# Patient Record
Sex: Female | Born: 1960
Health system: Southern US, Community
[De-identification: ages and names within clinical notes are randomized; demographics above are authoritative.]

## PROBLEM LIST (undated history)

## (undated) DIAGNOSIS — M549 Dorsalgia, unspecified: Secondary | ICD-10-CM

## (undated) DIAGNOSIS — M1990 Unspecified osteoarthritis, unspecified site: Secondary | ICD-10-CM

## (undated) DIAGNOSIS — K219 Gastro-esophageal reflux disease without esophagitis: Secondary | ICD-10-CM

## (undated) DIAGNOSIS — M797 Fibromyalgia: Secondary | ICD-10-CM

## (undated) DIAGNOSIS — G8929 Other chronic pain: Secondary | ICD-10-CM

## (undated) DIAGNOSIS — I10 Essential (primary) hypertension: Secondary | ICD-10-CM

## (undated) HISTORY — PX: HEMORRHOID SURGERY: SHX153

## (undated) HISTORY — PX: NECK SURGERY: SHX720

## (undated) HISTORY — PX: CHOLECYSTECTOMY: SHX55

## (undated) HISTORY — PX: APPENDECTOMY: SHX54

## (undated) HISTORY — PX: CARPAL TUNNEL RELEASE: SHX101

---

## 1997-11-30 ENCOUNTER — Inpatient Hospital Stay (HOSPITAL_COMMUNITY): Admission: AD | Admit: 1997-11-30 | Discharge: 1997-11-30 | Payer: Self-pay | Admitting: Obstetrics

## 1997-12-03 ENCOUNTER — Inpatient Hospital Stay (HOSPITAL_COMMUNITY): Admission: AD | Admit: 1997-12-03 | Discharge: 1997-12-03 | Payer: Self-pay | Admitting: Obstetrics

## 1998-02-21 ENCOUNTER — Emergency Department (HOSPITAL_COMMUNITY): Admission: EM | Admit: 1998-02-21 | Discharge: 1998-02-21 | Payer: Self-pay | Admitting: Emergency Medicine

## 1998-03-11 ENCOUNTER — Ambulatory Visit (HOSPITAL_COMMUNITY): Admission: RE | Admit: 1998-03-11 | Discharge: 1998-03-11 | Payer: Self-pay | Admitting: Neurosurgery

## 1998-03-11 ENCOUNTER — Encounter: Payer: Self-pay | Admitting: Neurosurgery

## 1998-04-03 ENCOUNTER — Emergency Department (HOSPITAL_COMMUNITY): Admission: EM | Admit: 1998-04-03 | Discharge: 1998-04-03 | Payer: Self-pay | Admitting: Emergency Medicine

## 1998-05-26 ENCOUNTER — Encounter: Payer: Self-pay | Admitting: Emergency Medicine

## 1998-05-26 ENCOUNTER — Emergency Department (HOSPITAL_COMMUNITY): Admission: EM | Admit: 1998-05-26 | Discharge: 1998-05-27 | Payer: Self-pay | Admitting: Emergency Medicine

## 1998-06-24 ENCOUNTER — Inpatient Hospital Stay (HOSPITAL_COMMUNITY): Admission: EM | Admit: 1998-06-24 | Discharge: 1998-06-27 | Payer: Self-pay | Admitting: Emergency Medicine

## 1998-09-18 ENCOUNTER — Emergency Department (HOSPITAL_COMMUNITY): Admission: EM | Admit: 1998-09-18 | Discharge: 1998-09-19 | Payer: Self-pay | Admitting: Emergency Medicine

## 1998-09-19 ENCOUNTER — Encounter: Payer: Self-pay | Admitting: Emergency Medicine

## 1999-03-17 ENCOUNTER — Other Ambulatory Visit: Admission: RE | Admit: 1999-03-17 | Discharge: 1999-03-17 | Payer: Self-pay | Admitting: Obstetrics and Gynecology

## 1999-05-01 ENCOUNTER — Encounter: Payer: Self-pay | Admitting: Obstetrics and Gynecology

## 1999-05-01 ENCOUNTER — Ambulatory Visit (HOSPITAL_COMMUNITY): Admission: RE | Admit: 1999-05-01 | Discharge: 1999-05-01 | Payer: Self-pay | Admitting: Obstetrics and Gynecology

## 1999-05-28 ENCOUNTER — Ambulatory Visit (HOSPITAL_COMMUNITY): Admission: RE | Admit: 1999-05-28 | Discharge: 1999-05-28 | Payer: Self-pay | Admitting: Obstetrics and Gynecology

## 1999-05-28 ENCOUNTER — Encounter: Payer: Self-pay | Admitting: Obstetrics and Gynecology

## 1999-06-16 ENCOUNTER — Encounter: Payer: Self-pay | Admitting: Obstetrics and Gynecology

## 1999-06-16 ENCOUNTER — Ambulatory Visit (HOSPITAL_COMMUNITY): Admission: RE | Admit: 1999-06-16 | Discharge: 1999-06-16 | Payer: Self-pay | Admitting: Obstetrics and Gynecology

## 1999-06-30 ENCOUNTER — Inpatient Hospital Stay (HOSPITAL_COMMUNITY): Admission: AD | Admit: 1999-06-30 | Discharge: 1999-06-30 | Payer: Self-pay | Admitting: Obstetrics and Gynecology

## 1999-07-07 ENCOUNTER — Ambulatory Visit (HOSPITAL_COMMUNITY): Admission: RE | Admit: 1999-07-07 | Discharge: 1999-07-07 | Payer: Self-pay | Admitting: Obstetrics and Gynecology

## 1999-07-07 ENCOUNTER — Encounter: Payer: Self-pay | Admitting: Obstetrics and Gynecology

## 1999-07-08 ENCOUNTER — Ambulatory Visit (HOSPITAL_COMMUNITY): Admission: RE | Admit: 1999-07-08 | Discharge: 1999-07-08 | Payer: Self-pay | Admitting: Obstetrics and Gynecology

## 1999-07-13 ENCOUNTER — Inpatient Hospital Stay (HOSPITAL_COMMUNITY): Admission: AD | Admit: 1999-07-13 | Discharge: 1999-07-13 | Payer: Self-pay | Admitting: Obstetrics and Gynecology

## 1999-07-28 ENCOUNTER — Encounter: Payer: Self-pay | Admitting: Obstetrics and Gynecology

## 1999-07-28 ENCOUNTER — Ambulatory Visit (HOSPITAL_COMMUNITY): Admission: RE | Admit: 1999-07-28 | Discharge: 1999-07-28 | Payer: Self-pay | Admitting: Obstetrics and Gynecology

## 1999-08-02 ENCOUNTER — Inpatient Hospital Stay (HOSPITAL_COMMUNITY): Admission: AD | Admit: 1999-08-02 | Discharge: 1999-08-02 | Payer: Self-pay | Admitting: Obstetrics and Gynecology

## 1999-08-21 ENCOUNTER — Inpatient Hospital Stay (HOSPITAL_COMMUNITY): Admission: AD | Admit: 1999-08-21 | Discharge: 1999-08-21 | Payer: Self-pay | Admitting: Obstetrics and Gynecology

## 1999-08-29 ENCOUNTER — Encounter (HOSPITAL_COMMUNITY): Admission: RE | Admit: 1999-08-29 | Discharge: 1999-11-27 | Payer: Self-pay | Admitting: Obstetrics and Gynecology

## 1999-09-01 ENCOUNTER — Observation Stay (HOSPITAL_COMMUNITY): Admission: AD | Admit: 1999-09-01 | Discharge: 1999-09-01 | Payer: Self-pay | Admitting: Obstetrics and Gynecology

## 1999-09-01 ENCOUNTER — Encounter: Payer: Self-pay | Admitting: Obstetrics and Gynecology

## 2000-01-08 ENCOUNTER — Ambulatory Visit (HOSPITAL_COMMUNITY): Admission: RE | Admit: 2000-01-08 | Discharge: 2000-01-08 | Payer: Self-pay | Admitting: Obstetrics and Gynecology

## 2000-01-08 ENCOUNTER — Encounter: Payer: Self-pay | Admitting: Obstetrics and Gynecology

## 2000-01-28 ENCOUNTER — Emergency Department (HOSPITAL_COMMUNITY): Admission: EM | Admit: 2000-01-28 | Discharge: 2000-01-28 | Payer: Self-pay | Admitting: *Deleted

## 2000-02-09 ENCOUNTER — Encounter: Payer: Self-pay | Admitting: Obstetrics and Gynecology

## 2000-02-09 ENCOUNTER — Ambulatory Visit (HOSPITAL_COMMUNITY): Admission: RE | Admit: 2000-02-09 | Discharge: 2000-02-09 | Payer: Self-pay | Admitting: Obstetrics and Gynecology

## 2000-02-28 ENCOUNTER — Encounter: Payer: Self-pay | Admitting: Neurosurgery

## 2000-02-28 ENCOUNTER — Ambulatory Visit (HOSPITAL_COMMUNITY): Admission: RE | Admit: 2000-02-28 | Discharge: 2000-02-28 | Payer: Self-pay | Admitting: Neurosurgery

## 2001-03-05 ENCOUNTER — Ambulatory Visit (HOSPITAL_COMMUNITY): Admission: RE | Admit: 2001-03-05 | Discharge: 2001-03-05 | Payer: Self-pay | Admitting: Neurosurgery

## 2001-03-05 ENCOUNTER — Encounter: Payer: Self-pay | Admitting: Neurosurgery

## 2001-04-19 ENCOUNTER — Encounter: Payer: Self-pay | Admitting: Neurosurgery

## 2001-04-19 ENCOUNTER — Inpatient Hospital Stay (HOSPITAL_COMMUNITY): Admission: RE | Admit: 2001-04-19 | Discharge: 2001-04-20 | Payer: Self-pay | Admitting: Neurosurgery

## 2004-02-10 ENCOUNTER — Emergency Department (HOSPITAL_COMMUNITY): Admission: EM | Admit: 2004-02-10 | Discharge: 2004-02-10 | Payer: Self-pay | Admitting: Emergency Medicine

## 2005-10-07 ENCOUNTER — Emergency Department (HOSPITAL_COMMUNITY): Admission: EM | Admit: 2005-10-07 | Discharge: 2005-10-07 | Payer: Self-pay | Admitting: *Deleted

## 2005-11-23 ENCOUNTER — Emergency Department (HOSPITAL_COMMUNITY): Admission: EM | Admit: 2005-11-23 | Discharge: 2005-11-23 | Payer: Self-pay | Admitting: Emergency Medicine

## 2006-01-28 ENCOUNTER — Emergency Department (HOSPITAL_COMMUNITY): Admission: EM | Admit: 2006-01-28 | Discharge: 2006-01-28 | Payer: Self-pay | Admitting: Emergency Medicine

## 2006-02-27 ENCOUNTER — Emergency Department (HOSPITAL_COMMUNITY): Admission: EM | Admit: 2006-02-27 | Discharge: 2006-02-28 | Payer: Self-pay | Admitting: Emergency Medicine

## 2006-03-06 ENCOUNTER — Emergency Department (HOSPITAL_COMMUNITY): Admission: EM | Admit: 2006-03-06 | Discharge: 2006-03-06 | Payer: Self-pay | Admitting: Emergency Medicine

## 2006-06-13 ENCOUNTER — Emergency Department (HOSPITAL_COMMUNITY): Admission: EM | Admit: 2006-06-13 | Discharge: 2006-06-14 | Payer: Self-pay | Admitting: Emergency Medicine

## 2006-08-03 ENCOUNTER — Emergency Department (HOSPITAL_COMMUNITY): Admission: EM | Admit: 2006-08-03 | Discharge: 2006-08-03 | Payer: Self-pay | Admitting: Emergency Medicine

## 2006-08-29 ENCOUNTER — Emergency Department (HOSPITAL_COMMUNITY): Admission: EM | Admit: 2006-08-29 | Discharge: 2006-08-29 | Payer: Self-pay | Admitting: Emergency Medicine

## 2006-10-14 ENCOUNTER — Ambulatory Visit (HOSPITAL_COMMUNITY): Admission: RE | Admit: 2006-10-14 | Discharge: 2006-10-14 | Payer: Self-pay | Admitting: Obstetrics

## 2006-10-18 ENCOUNTER — Ambulatory Visit: Payer: Self-pay | Admitting: Family Medicine

## 2006-10-28 ENCOUNTER — Emergency Department (HOSPITAL_COMMUNITY): Admission: EM | Admit: 2006-10-28 | Discharge: 2006-10-29 | Payer: Self-pay | Admitting: Emergency Medicine

## 2006-11-09 ENCOUNTER — Ambulatory Visit: Payer: Self-pay | Admitting: Family Medicine

## 2006-11-09 LAB — CONVERTED CEMR LAB
ALT: 16 units/L (ref 0–35)
Albumin: 4.6 g/dL (ref 3.5–5.2)
BUN: 19 mg/dL (ref 6–23)
Basophils Absolute: 0 10*3/uL (ref 0.0–0.1)
CO2: 27 meq/L (ref 19–32)
Calcium: 9.6 mg/dL (ref 8.4–10.5)
Chloride: 102 meq/L (ref 96–112)
Creatinine, Ser: 1.33 mg/dL — ABNORMAL HIGH (ref 0.40–1.20)
Hemoglobin: 14.4 g/dL (ref 12.0–15.0)
Lymphocytes Relative: 31 % (ref 12–46)
Monocytes Absolute: 0.4 10*3/uL (ref 0.2–0.7)
Monocytes Relative: 6 % (ref 3–11)
Neutro Abs: 4.6 10*3/uL (ref 1.7–7.7)
Potassium: 4 meq/L (ref 3.5–5.3)
RBC: 4.43 M/uL (ref 3.87–5.11)
WBC: 7.4 10*3/uL (ref 4.0–10.5)

## 2007-01-03 ENCOUNTER — Encounter: Admission: RE | Admit: 2007-01-03 | Discharge: 2007-01-03 | Payer: Self-pay | Admitting: Neurosurgery

## 2007-02-15 ENCOUNTER — Ambulatory Visit (HOSPITAL_COMMUNITY): Admission: RE | Admit: 2007-02-15 | Discharge: 2007-02-15 | Payer: Self-pay | Admitting: Family Medicine

## 2007-03-18 ENCOUNTER — Ambulatory Visit: Payer: Self-pay | Admitting: Internal Medicine

## 2007-05-13 ENCOUNTER — Ambulatory Visit: Payer: Self-pay | Admitting: Family Medicine

## 2007-05-19 ENCOUNTER — Emergency Department (HOSPITAL_COMMUNITY): Admission: EM | Admit: 2007-05-19 | Discharge: 2007-05-20 | Payer: Self-pay | Admitting: Emergency Medicine

## 2007-10-04 ENCOUNTER — Encounter (INDEPENDENT_AMBULATORY_CARE_PROVIDER_SITE_OTHER): Payer: Self-pay | Admitting: Family Medicine

## 2007-10-04 ENCOUNTER — Ambulatory Visit: Payer: Self-pay | Admitting: Internal Medicine

## 2007-10-04 LAB — CONVERTED CEMR LAB
Chloride: 104 meq/L (ref 96–112)
Creatinine, Ser: 0.89 mg/dL (ref 0.40–1.20)
Free T4: 0.85 ng/dL — ABNORMAL LOW (ref 0.89–1.80)
Helicobacter Pylori Antibody-IgG: 0.4
TSH: 1.234 microintl units/mL (ref 0.350–4.50)
Vit D, 1,25-Dihydroxy: 27 — ABNORMAL LOW (ref 30–89)

## 2007-10-11 ENCOUNTER — Ambulatory Visit: Payer: Self-pay | Admitting: Family Medicine

## 2007-11-16 ENCOUNTER — Emergency Department (HOSPITAL_COMMUNITY): Admission: EM | Admit: 2007-11-16 | Discharge: 2007-11-17 | Payer: Self-pay | Admitting: Emergency Medicine

## 2007-11-27 ENCOUNTER — Emergency Department (HOSPITAL_COMMUNITY): Admission: EM | Admit: 2007-11-27 | Discharge: 2007-11-27 | Payer: Self-pay | Admitting: Emergency Medicine

## 2007-12-07 ENCOUNTER — Ambulatory Visit (HOSPITAL_COMMUNITY): Admission: RE | Admit: 2007-12-07 | Discharge: 2007-12-07 | Payer: Self-pay | Admitting: Obstetrics

## 2007-12-13 ENCOUNTER — Ambulatory Visit: Payer: Self-pay | Admitting: Family Medicine

## 2007-12-20 ENCOUNTER — Encounter: Admission: RE | Admit: 2007-12-20 | Discharge: 2007-12-20 | Payer: Self-pay | Admitting: Obstetrics

## 2008-01-06 ENCOUNTER — Ambulatory Visit: Payer: Self-pay | Admitting: Family Medicine

## 2008-03-08 ENCOUNTER — Ambulatory Visit: Payer: Self-pay | Admitting: Family Medicine

## 2008-03-09 ENCOUNTER — Ambulatory Visit: Payer: Self-pay | Admitting: Family Medicine

## 2008-03-09 LAB — CONVERTED CEMR LAB
AST: 17 units/L (ref 0–37)
Albumin: 4.2 g/dL (ref 3.5–5.2)
Alkaline Phosphatase: 42 units/L (ref 39–117)
BUN: 16 mg/dL (ref 6–23)
Creatinine, Ser: 0.83 mg/dL (ref 0.40–1.20)
Glucose, Bld: 96 mg/dL (ref 70–99)
HDL: 45 mg/dL (ref >39)
LDL Cholesterol: 146 mg/dL — ABNORMAL HIGH (ref 0–99)
Potassium: 4.2 meq/L (ref 3.5–5.3)
TSH: 1.239 microintl units/mL (ref 0.350–4.50)
Total Bilirubin: 0.5 mg/dL (ref 0.3–1.2)
Total CHOL/HDL Ratio: 4.9
Triglycerides: 147 mg/dL (ref <150)
VLDL: 29 mg/dL (ref 0–40)

## 2008-04-10 ENCOUNTER — Ambulatory Visit: Payer: Self-pay | Admitting: Family Medicine

## 2008-04-12 ENCOUNTER — Ambulatory Visit: Payer: Self-pay | Admitting: Family Medicine

## 2008-04-22 ENCOUNTER — Emergency Department (HOSPITAL_COMMUNITY): Admission: EM | Admit: 2008-04-22 | Discharge: 2008-04-23 | Payer: Self-pay | Admitting: Emergency Medicine

## 2008-05-17 ENCOUNTER — Emergency Department (HOSPITAL_COMMUNITY): Admission: EM | Admit: 2008-05-17 | Discharge: 2008-05-17 | Payer: Self-pay | Admitting: Emergency Medicine

## 2008-06-29 ENCOUNTER — Emergency Department (HOSPITAL_COMMUNITY): Admission: EM | Admit: 2008-06-29 | Discharge: 2008-06-29 | Payer: Self-pay | Admitting: Emergency Medicine

## 2008-09-19 ENCOUNTER — Telehealth (INDEPENDENT_AMBULATORY_CARE_PROVIDER_SITE_OTHER): Payer: Self-pay | Admitting: *Deleted

## 2008-09-20 ENCOUNTER — Ambulatory Visit: Payer: Self-pay | Admitting: Internal Medicine

## 2008-10-09 ENCOUNTER — Ambulatory Visit: Payer: Self-pay | Admitting: Gastroenterology

## 2008-10-23 ENCOUNTER — Ambulatory Visit: Payer: Self-pay | Admitting: Gastroenterology

## 2008-10-23 ENCOUNTER — Encounter: Payer: Self-pay | Admitting: Gastroenterology

## 2008-10-24 ENCOUNTER — Encounter: Payer: Self-pay | Admitting: Gastroenterology

## 2008-12-09 ENCOUNTER — Emergency Department (HOSPITAL_COMMUNITY): Admission: EM | Admit: 2008-12-09 | Discharge: 2008-12-09 | Payer: Self-pay | Admitting: Emergency Medicine

## 2009-12-08 ENCOUNTER — Emergency Department (HOSPITAL_COMMUNITY): Admission: EM | Admit: 2009-12-08 | Discharge: 2009-12-08 | Payer: Self-pay | Admitting: Emergency Medicine

## 2010-02-09 ENCOUNTER — Encounter: Payer: Self-pay | Admitting: Obstetrics

## 2010-02-27 ENCOUNTER — Emergency Department (HOSPITAL_COMMUNITY)
Admission: EM | Admit: 2010-02-27 | Discharge: 2010-02-28 | Disposition: A | Discharge status: Home / Self Care | Payer: Medicaid Other | Attending: Emergency Medicine | Admitting: Emergency Medicine

## 2010-02-27 ENCOUNTER — Emergency Department (HOSPITAL_COMMUNITY): Payer: Medicaid Other

## 2010-02-27 DIAGNOSIS — H53149 Visual discomfort, unspecified: Secondary | ICD-10-CM | POA: Insufficient documentation

## 2010-02-27 DIAGNOSIS — G43909 Migraine, unspecified, not intractable, without status migrainosus: Secondary | ICD-10-CM | POA: Insufficient documentation

## 2010-02-27 DIAGNOSIS — R209 Unspecified disturbances of skin sensation: Secondary | ICD-10-CM | POA: Insufficient documentation

## 2010-02-27 DIAGNOSIS — I10 Essential (primary) hypertension: Secondary | ICD-10-CM | POA: Insufficient documentation

## 2010-02-27 DIAGNOSIS — IMO0001 Reserved for inherently not codable concepts without codable children: Secondary | ICD-10-CM | POA: Insufficient documentation

## 2010-02-27 LAB — CBC
HCT: 38.9 % (ref 36.0–46.0)
MCHC: 35 g/dL (ref 30.0–36.0)
Platelets: 178 10*3/uL (ref 150–400)
RDW: 12.1 % (ref 11.5–15.5)
WBC: 9.7 10*3/uL (ref 4.0–10.5)

## 2010-02-27 LAB — BASIC METABOLIC PANEL
BUN: 14 mg/dL (ref 6–23)
Calcium: 8.7 mg/dL (ref 8.4–10.5)
GFR calc non Af Amer: >60 mL/min (ref >60)
Glucose, Bld: 101 mg/dL — ABNORMAL HIGH (ref 70–99)
Potassium: 3.8 mEq/L (ref 3.5–5.1)
Sodium: 137 mEq/L (ref 135–145)

## 2010-02-27 LAB — DIFFERENTIAL
Basophils Absolute: 0 10*3/uL (ref 0.0–0.1)
Basophils Relative: 0 % (ref 0–1)
Eosinophils Absolute: 0.3 10*3/uL (ref 0.0–0.7)
Eosinophils Relative: 3 % (ref 0–5)
Monocytes Absolute: 0.6 10*3/uL (ref 0.1–1.0)

## 2010-06-06 NOTE — H&P (Signed)
[facility]  Patient:    [name], [name] Visit Number: [id number] MRN: [id number]          Service Type: SUR Location: [id number] Attending Physician:  Emeterio Reeve Dictated by:   Payton Doughty, M.D. Admit Date:  04/19/2001 Discharge Date: 04/20/2001                           History and Physical  ADMITTING DIAGNOSIS:  Spondylosis, L4-5.  SERVICE:  Neurosurgery.  HISTORY OF PRESENT ILLNESS:  This is a now 50 year old right-handed white girl who I saw initially in 1998 for neck pain that resolved slowly.  I visited with her again in early February of this year.  She has had low back pain with pain down her left lower extremity.  She had had an MRI about a year ago that showed spondylitic change with left lateral foraminal encroachment.  It was not below her knee and was not really consistent with L5; it was consistent with L4.  Followup MRI was obtained that shows a really narrowed foramen at L4-5 and the 4 root encumbered as it enters it and she is now admitted for an L4-5 laminotomy and foraminotomy.  MEDICAL HISTORY:  Medical history is otherwise unremarkable.  ALLERGIES:  She has no allergies.  PAST SURGICAL HISTORY:  She has had no operations.  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  REVIEW OF SYSTEMS:  Remarkable for back pain and pain in her left leg.  There is no bladder dysfunction.  FAMILY HISTORY:  Mom is 57 and in good health.  Daddy is 65 and in good health and has hypertension.  PHYSICAL EXAMINATION:  HEENT:  Within normal limits.  NECK:  She has limited range of motion of her neck because of some neck discomfort.  CHEST:  Clear.  CARDIAC:  Regular rate and rhythm.  ABDOMEN:  Nontender with no hepatosplenomegaly.  EXTREMITIES:  Without clubbing or cyanosis.  GU:  Deferred.  PERIPHERAL PULSES:  Good.  NEUROLOGIC:  She  is awake, alert and oriented.  Her cranial nerves are intact. Motor exam shows 5/5 throughout the upper and lower extremities.  The pain is in the distribution of the L4 nerve root.  There is no sensory deficit. Reflexes are intact.  Straight leg raise is positive on the left.  LABORATORY AND ACCESSORY DATA:  MRI shows tightening in her foramen on the left side at 4-5.  This is mainly due to facet arthropathy and a small foramen.  PLAN:  The plan is for a left laminotomy and foraminotomy at L4-5 for decompression of the 4 root.  The risks and benefits of this approach have been discussed with her and she wishes to proceed. Dictated by:   Payton Doughty, M.D. Attending Physician:  Emeterio Reeve DD:  04/19/01 TD:  04/19/01 Job: [id number]

## 2010-06-06 NOTE — Op Note (Signed)
[place]  Patient:    [name] [name], [name] [name] Visit Number: [id number] MRN: [id number]          Service Type: SUR Location: 3000 [id number] Attending Physician:  Emeterio Reeve Dictated by:   Payton Doughty, M.D. Proc. Date: 04/19/01 Admit Date:  04/19/2001 Discharge Date: 04/20/2001                             Operative Report  PREOPERATIVE DIAGNOSIS:  Spondylosis L4-5 on the left.  POSTOPERATIVE DIAGNOSIS:  Spondylosis L4-5 on the left.  OPERATION PERFORMED:  Left L4-5 laminectomy, laminotomy and foraminotomy.  SURGEON:  Payton Doughty, M.D.  COMPLICATIONS:  None.  ASSISTANT: 1. Stefani Dama, M.D. 2. [place].  ANESTHESIA:  General endotracheal.  PREP:  Sterile Betadine prep and scrub with alcohol wipe.  DESCRIPTION OF PROCEDURE:  The patient is a 50 year old lady with L4 radiculopathy on the left side secondary to spondylosis.  The patient was taken to the operating room, smoothly anesthetized and intubated, and placed prone on the operating table.  Following shave, prep and drape in the usual sterile fashion, the skin was infiltrated with 1% lidocaine, 1:400,000 epinephrine.  Skin was incised from mid-L5 to the top of L4 and the lamina of L4 and L5 were exposed on the left side in the subperiosteal plane. Intraoperative x-ray confirmed correctness of level.  Hemisemilaminectomy of L4 was carried out to the top of the ligamentum flavum which was removed in a retrograde fashion.  The L5 root was identified and foraminotomy carried out over it down around the 5 pedicle, somewhat undercutting 5.  The superior aspect of the foramen was was then explored and there  was overhang which compressed the left L4 root.  This was generously decompressed leaving the pars interarticularis intact.  The neural foramen was carefully explored and found to be open.  The wound was irrigated and hemostasis assured.  The laminectomy defect was covered with  Depo-Medrol soaked fat.  The wound was irrigated and hemostasis assured.  The fascia was reapproximated with 0 Vicryl in interrupted fashion.  Subcutaneous tissues reapproximated with 0 Vicryl in interrupted fashion.  Subcuticular tissues reapproximated with 3-0 Vicryl in interrupted fashion.  Skin was closed with 4-0 Vicryl in a running subcuticular fashion.  Benzoin and Steri-Strips were placed and made occlusive with Telfa and OpSite.  The patient then returned to the recovery room in good condition. Dictated by:   Payton Doughty, M.D. Attending Physician:  Emeterio Reeve DD:  04/19/01 TD:  04/19/01 Job: [id number]

## 2010-10-20 LAB — POCT I-STAT, CHEM 8
BUN: 16
Calcium, Ion: 1.06 — ABNORMAL LOW
Chloride: 108
HCT: 42
Sodium: 141
TCO2: 23

## 2010-10-20 LAB — PROTIME-INR: Prothrombin Time: 11.4 — ABNORMAL LOW

## 2010-10-20 LAB — CBC
Platelets: 249
RBC: 4.46
WBC: 10.3

## 2010-10-21 LAB — DIFFERENTIAL
Basophils Absolute: 0
Basophils Relative: 0
Eosinophils Absolute: 0.1
Monocytes Relative: 2 — ABNORMAL LOW
Neutrophils Relative %: 85 — ABNORMAL HIGH

## 2010-10-21 LAB — RAPID STREP SCREEN (MED CTR MEBANE ONLY): Streptococcus, Group A Screen (Direct): NEGATIVE

## 2010-10-21 LAB — CBC
MCHC: 34
MCV: 96
Platelets: 200
RBC: 4.73
RDW: 12.6

## 2010-10-21 LAB — URINALYSIS, ROUTINE W REFLEX MICROSCOPIC
Ketones, ur: NEGATIVE
Nitrite: NEGATIVE
Protein, ur: NEGATIVE
Urobilinogen, UA: 0.2

## 2010-10-21 LAB — BASIC METABOLIC PANEL
BUN: 12
CO2: 22
Chloride: 106
Creatinine, Ser: 0.87
Glucose, Bld: 103 — ABNORMAL HIGH

## 2010-10-21 LAB — URINE MICROSCOPIC-ADD ON

## 2010-10-30 LAB — COMPREHENSIVE METABOLIC PANEL
Albumin: 4.6
Alkaline Phosphatase: 39
BUN: 12
Chloride: 99
Glucose, Bld: 103 — ABNORMAL HIGH
Potassium: 3.3 — ABNORMAL LOW
Total Bilirubin: 1.2

## 2010-10-30 LAB — DIFFERENTIAL
Basophils Absolute: 0
Basophils Relative: 0
Eosinophils Absolute: 0
Monocytes Absolute: 0.3
Neutro Abs: 4.8
Neutrophils Relative %: 69

## 2010-10-30 LAB — URINALYSIS, ROUTINE W REFLEX MICROSCOPIC
Glucose, UA: NEGATIVE
Ketones, ur: NEGATIVE
Leukocytes, UA: NEGATIVE
Protein, ur: NEGATIVE

## 2010-10-30 LAB — URINE MICROSCOPIC-ADD ON

## 2010-10-30 LAB — CBC
HCT: 42.4
Hemoglobin: 14.5
WBC: 7.1

## 2011-02-01 ENCOUNTER — Encounter (HOSPITAL_COMMUNITY): Payer: Self-pay | Admitting: Adult Health

## 2011-02-01 ENCOUNTER — Emergency Department (HOSPITAL_COMMUNITY): Payer: Self-pay

## 2011-02-01 ENCOUNTER — Emergency Department (HOSPITAL_COMMUNITY)
Admission: EM | Admit: 2011-02-01 | Discharge: 2011-02-02 | Disposition: A | Discharge status: Home / Self Care | Payer: Self-pay | Attending: Emergency Medicine | Admitting: Emergency Medicine

## 2011-02-01 DIAGNOSIS — R51 Headache: Secondary | ICD-10-CM | POA: Insufficient documentation

## 2011-02-01 DIAGNOSIS — R110 Nausea: Secondary | ICD-10-CM | POA: Insufficient documentation

## 2011-02-01 DIAGNOSIS — R079 Chest pain, unspecified: Secondary | ICD-10-CM | POA: Insufficient documentation

## 2011-02-01 DIAGNOSIS — R05 Cough: Secondary | ICD-10-CM | POA: Insufficient documentation

## 2011-02-01 DIAGNOSIS — R059 Cough, unspecified: Secondary | ICD-10-CM | POA: Insufficient documentation

## 2011-02-01 DIAGNOSIS — H53149 Visual discomfort, unspecified: Secondary | ICD-10-CM | POA: Insufficient documentation

## 2011-02-01 HISTORY — DX: Unspecified osteoarthritis, unspecified site: M19.90

## 2011-02-01 HISTORY — DX: Essential (primary) hypertension: I10

## 2011-02-01 HISTORY — DX: Fibromyalgia: M79.7

## 2011-02-01 MED ORDER — SODIUM CHLORIDE 0.9 % IV BOLUS (SEPSIS)
1000.0000 mL | Freq: Once | INTRAVENOUS | Status: AC
  Administered 2011-02-01: 1000 mL via INTRAVENOUS

## 2011-02-01 MED ORDER — KETOROLAC TROMETHAMINE 30 MG/ML IJ SOLN
30.0000 mg | Freq: Once | INTRAMUSCULAR | Status: AC
  Administered 2011-02-01: 30 mg via INTRAVENOUS
  Filled 2011-02-01: qty 1

## 2011-02-01 MED ORDER — HYDROMORPHONE HCL PF 1 MG/ML IJ SOLN
1.0000 mg | Freq: Once | INTRAMUSCULAR | Status: AC
  Administered 2011-02-01: 1 mg via INTRAVENOUS
  Filled 2011-02-01: qty 1

## 2011-02-01 MED ORDER — METOCLOPRAMIDE HCL 5 MG/ML IJ SOLN
INTRAMUSCULAR | Status: AC
  Filled 2011-02-01: qty 2

## 2011-02-01 MED ORDER — PROCHLORPERAZINE EDISYLATE 5 MG/ML IJ SOLN: 10.0000 mg | Freq: Once | INTRAMUSCULAR | Status: DC

## 2011-02-01 MED ORDER — METOCLOPRAMIDE HCL 5 MG/ML IJ SOLN
10.0000 mg | Freq: Once | INTRAMUSCULAR | Status: AC
  Administered 2011-02-01: 10 mg via INTRAVENOUS

## 2011-02-01 NOTE — ED Notes (Signed)
Pt's husband at bedside at this time, verbalizing that he is not pleased with his wife's care at this time. States he would like to talk to the charge RN and the doctor at this time. Dois Davenport, RN notified and aware. Dr. Ranae Palms notified and aware. Housekeeping paged to mop floor in room. Odor eliminator sprayed in room. Dirty linen and trash taken out of room and new linens placed on stretcher. Blankets x3 given to patient. Meds given as charted. Will continue to monitor.

## 2011-02-01 NOTE — ED Provider Notes (Signed)
History     CSN: [id number]  Arrival date & time 02/01/11  1732   First MD Initiated Contact with Patient 02/01/11 2109      Chief Complaint  Patient presents with  . Headache    (Consider location/radiation/quality/duration/timing/severity/associated sxs/prior treatment) Patient is a 51 y.o. female presenting with headaches. The history is provided by the patient.  Headache  This is a recurrent problem. The current episode started 6 to 12 hours ago. The problem occurs constantly. The problem has not changed since onset.The headache is associated with bright light, loud noise and coughing. The pain is located in the bilateral region. The quality of the pain is described as throbbing. The pain is moderate. Associated symptoms include nausea. Pertinent negatives include no fever, no chest pressure, no palpitations, no shortness of breath and no vomiting. She has tried nothing for the symptoms.  Pt has long history of migraines and HA she is having today is the same in nature as previous HA. She did not take any pain meds at home. HA was gradual onset starting at noon. She has no neuro deficits, visual changes.  Past Medical History  Diagnosis Date  . Fibromyalgia   . Hypertension   . Arthritis     Past Surgical History  Procedure Date  . Neck surgery     History reviewed. No pertinent family history.  History  Substance Use Topics  . Smoking status: Never Smoker   . Smokeless tobacco: Not on file  . Alcohol Use: Yes    OB History    Grav Para Term Preterm Abortions TAB SAB Ect Mult Living                  Review of Systems  Constitutional: Negative for fever and chills.  HENT: Negative for neck stiffness.   Eyes: Positive for photophobia. Negative for visual disturbance.  Respiratory: Positive for cough. Negative for chest tightness and shortness of breath.   Cardiovascular: Negative for chest pain, palpitations and leg swelling.  Gastrointestinal: Positive for  nausea. Negative for vomiting, abdominal pain and diarrhea.  Musculoskeletal: Negative for myalgias, back pain, joint swelling and arthralgias.  Skin: Negative for color change, pallor, rash and wound.  Neurological: Positive for headaches. Negative for dizziness, speech difficulty, weakness, light-headedness and numbness.    Allergies  Review of patient's allergies indicates no known allergies.  Home Medications   Current Outpatient Rx  Name Route Sig Dispense Refill  . IBUPROFEN 200 MG PO TABS Oral Take 1,200 mg by mouth every 6 (six) hours as needed. headaches      BP 175/101  Pulse 79  Temp(Src) 99 F (37.2 C) (Oral)  Resp 17  SpO2 97%  Physical Exam  Nursing note and vitals reviewed. Constitutional: She is oriented to person, place, and time. She appears well-developed and well-nourished.       Pt in mild distress shielding eyes from light  HENT:  Head: Normocephalic and atraumatic.  Mouth/Throat: Oropharynx is clear and moist.  Eyes: EOM are normal. Pupils are equal, round, and reactive to light.  Neck: Normal range of motion. Neck supple.  Cardiovascular: Normal rate and regular rhythm.  Exam reveals no gallop and no friction rub.   No murmur heard. Pulmonary/Chest: Effort normal and breath sounds normal. No respiratory distress. She has no wheezes. She has no rales.  Abdominal: Soft. Bowel sounds are normal. She exhibits no distension and no mass. There is no tenderness. There is no rebound and no guarding.  Musculoskeletal: Normal range of motion. She exhibits no edema and no tenderness.  Neurological: She is alert and oriented to person, place, and time.       5/5 motor in all ext, sensation intact, CN II-XII intact, finger-to-nose intact.   Skin: Skin is warm and dry. No rash noted. No erythema.  Psychiatric: She has a normal mood and affect. Her behavior is normal.    ED Course  Procedures (including critical care time)  Labs Reviewed - No data to display No  results found.   No diagnosis found.    MDM    Pt seen and eval'd for similar HA here in the past including CT head. I believe this HA represents an exaggeration of pt's chronic HA and will treat symptomatically.         Loren Racer, MD 02/04/11 [phone]

## 2011-02-01 NOTE — ED Notes (Signed)
C/o worse headache of her life that began at 1230 this afternoon. Pain radiates to neck. Associated with light and sound sensitivity. Took IBprofen with no help. painis 10/10 associated with nausea. Pt is alert and oriented and answers all questions appropriately.

## 2011-02-01 NOTE — ED Notes (Signed)
Ranae Palms, MD to bedside.

## 2011-02-02 LAB — CBC
MCH: 31.8 pg (ref 26.0–34.0)
Platelets: 216 10*3/uL (ref 150–400)
RBC: 3.96 MIL/uL (ref 3.87–5.11)
RDW: 11.9 % (ref 11.5–15.5)

## 2011-02-02 LAB — BASIC METABOLIC PANEL
CO2: 25 mEq/L (ref 19–32)
Calcium: 8.1 mg/dL — ABNORMAL LOW (ref 8.4–10.5)
GFR calc Af Amer: >90 mL/min (ref >90)
GFR calc non Af Amer: >90 mL/min (ref >90)
Sodium: 138 mEq/L (ref 135–145)

## 2011-02-02 MED ORDER — NAPROXEN 500 MG PO TABS: 500.0000 mg | ORAL_TABLET | Freq: Two times a day (BID) | ORAL | Status: DC

## 2011-02-02 MED ORDER — PROMETHAZINE HCL 25 MG PO TABS: 25.0000 mg | ORAL_TABLET | Freq: Four times a day (QID) | ORAL | Status: AC | PRN

## 2011-02-02 MED ORDER — OXYCODONE-ACETAMINOPHEN 5-325 MG PO TABS: 1.0000 | ORAL_TABLET | ORAL | Status: AC | PRN

## 2011-02-02 NOTE — ED Provider Notes (Signed)
  Physical Exam  BP 175/101  Pulse 79  Temp(Src) 99 F (37.2 C) (Oral)  Resp 17  SpO2 97%  Physical Exam  ED Course  Procedures  MDM Patient has improved significantly with medications, requests discharge home at this time. Will give prescriptions and have followup with primary doctor or ER if symptoms worsen. Patient and spouse in understanding and agreeable to discharge plan      Vida Roller, MD 02/02/11 [phone]

## 2011-09-08 ENCOUNTER — Emergency Department (HOSPITAL_COMMUNITY)
Admission: EM | Admit: 2011-09-08 | Discharge: 2011-09-08 | Disposition: A | Discharge status: Home / Self Care | Payer: Medicaid Other | Attending: Emergency Medicine | Admitting: Emergency Medicine

## 2011-09-08 ENCOUNTER — Encounter (HOSPITAL_COMMUNITY): Payer: Self-pay | Admitting: *Deleted

## 2011-09-08 DIAGNOSIS — M129 Arthropathy, unspecified: Secondary | ICD-10-CM | POA: Insufficient documentation

## 2011-09-08 DIAGNOSIS — G8929 Other chronic pain: Secondary | ICD-10-CM | POA: Insufficient documentation

## 2011-09-08 DIAGNOSIS — W19XXXA Unspecified fall, initial encounter: Secondary | ICD-10-CM

## 2011-09-08 DIAGNOSIS — I10 Essential (primary) hypertension: Secondary | ICD-10-CM | POA: Insufficient documentation

## 2011-09-08 DIAGNOSIS — M545 Low back pain, unspecified: Secondary | ICD-10-CM | POA: Insufficient documentation

## 2011-09-08 DIAGNOSIS — M549 Dorsalgia, unspecified: Secondary | ICD-10-CM

## 2011-09-08 DIAGNOSIS — IMO0001 Reserved for inherently not codable concepts without codable children: Secondary | ICD-10-CM | POA: Insufficient documentation

## 2011-09-08 HISTORY — DX: Dorsalgia, unspecified: M54.9

## 2011-09-08 HISTORY — DX: Other chronic pain: G89.29

## 2011-09-08 MED ORDER — NAPROXEN 375 MG PO TABS: 375.0000 mg | ORAL_TABLET | Freq: Two times a day (BID) | ORAL | Status: AC

## 2011-09-08 MED ORDER — HYDROCODONE-ACETAMINOPHEN 5-325 MG PO TABS: 1.0000 | ORAL_TABLET | Freq: Four times a day (QID) | ORAL | Status: AC | PRN

## 2011-09-08 MED ORDER — OXYCODONE-ACETAMINOPHEN 5-325 MG PO TABS
1.0000 | ORAL_TABLET | Freq: Once | ORAL | Status: AC
  Administered 2011-09-08: 1 via ORAL
  Filled 2011-09-08: qty 1

## 2011-09-08 MED ORDER — KETOROLAC TROMETHAMINE 60 MG/2ML IM SOLN
60.0000 mg | Freq: Once | INTRAMUSCULAR | Status: AC
  Administered 2011-09-08: 60 mg via INTRAMUSCULAR
  Filled 2011-09-08 (×2): qty 2

## 2011-09-08 MED ORDER — ONDANSETRON 8 MG PO TBDP
8.0000 mg | ORAL_TABLET | Freq: Once | ORAL | Status: AC
  Administered 2011-09-08: 8 mg via ORAL
  Filled 2011-09-08: qty 1

## 2011-09-08 NOTE — ED Notes (Signed)
Pt states she is having lower back pain. Pt denies any numbness or tingling in bilateral lower ext

## 2011-09-08 NOTE — ED Provider Notes (Signed)
Medical screening examination/treatment/procedure(s) were performed by non-physician practitioner and as supervising physician I was immediately available for consultation/collaboration.   Lyanne Co, MD 09/08/11 [phone]

## 2011-09-08 NOTE — ED Provider Notes (Signed)
History     CSN: [id number]  Arrival date & time 09/08/11  1206   First MD Initiated Contact with Patient 09/08/11 1320      Chief Complaint  Patient presents with  . Back Pain    (Consider location/radiation/quality/duration/timing/severity/associated sxs/prior treatment) HPI Comments: Back Pain: Patient presents for presents evaluation of low back problems.  Symptoms have been present for 6 days and include pain in lower lumbar (aching and shooting in character; 10/10 in severity). Initial inciting event: saving a kid that had fallen in a bounce house. Symptoms are worst: all day. Alleviating factors identifiable by patient are none. Exacerbating factors identifiable by patient are bending backwards, bending forwards, bending sideways, running, sitting, standing and walking. Treatments so far initiated by patient: none Previous lower back problems: bulging disk . Previous workup: none. Previous treatments: none.    Patient is a 51 y.o. female presenting with back pain. The history is provided by the patient.  Back Pain  Pertinent negatives include no chest pain, no fever, no numbness, no headaches, no abdominal pain and no weakness.    Past Medical History  Diagnosis Date  . Fibromyalgia   . Hypertension   . Arthritis   . Chronic back pain     Past Surgical History  Procedure Date  . Neck surgery     No family history on file.  History  Substance Use Topics  . Smoking status: Never Smoker   . Smokeless tobacco: Not on file  . Alcohol Use: Yes    OB History    Grav Para Term Preterm Abortions TAB SAB Ect Mult Living                  Review of Systems  Constitutional: Positive for activity change. Negative for fever, chills, fatigue and unexpected weight change.  HENT: Negative for neck pain and neck stiffness.   Eyes: Negative for visual disturbance.  Respiratory: Negative for shortness of breath.   Cardiovascular: Negative for chest pain and leg swelling.    Gastrointestinal: Negative for nausea, abdominal pain, constipation and rectal pain.  Genitourinary: Negative for urgency and difficulty urinating.       Patient denies bowel and bladder incontinence.  Musculoskeletal: Positive for back pain and gait problem. Negative for myalgias, joint swelling and arthralgias.  Neurological: Negative for weakness, numbness and headaches.  All other systems reviewed and are negative.    Allergies  Review of patient's allergies indicates no known allergies.  Home Medications   Current Outpatient Rx  Name Route Sig Dispense Refill  . IBUPROFEN 200 MG PO TABS Oral Take 800 mg by mouth every 6 (six) hours as needed. Backache      BP 152/90  Pulse 63  Temp 99.1 F (37.3 C) (Oral)  Resp 18  SpO2 100%  Physical Exam  Nursing note and vitals reviewed. Constitutional: She is oriented to person, place, and time. She appears well-developed and well-nourished. No distress.  HENT:  Head: Normocephalic and atraumatic.  Eyes: Conjunctivae and EOM are normal. Pupils are equal, round, and reactive to light. No scleral icterus.  Neck: Normal range of motion and full passive range of motion without pain. Neck supple. No spinous process tenderness and no muscular tenderness present. No rigidity. Normal range of motion present. No Brudzinski's sign noted.  Cardiovascular: Normal rate, regular rhythm and intact distal pulses.  Exam reveals no gallop and no friction rub.   No murmur heard. Pulmonary/Chest: Effort normal and breath sounds normal. No respiratory  distress. She has no wheezes. She has no rales. She exhibits no tenderness.  Musculoskeletal:       Cervical back: She exhibits normal range of motion, no tenderness, no bony tenderness and no pain.       Thoracic back: She exhibits no tenderness, no bony tenderness and no pain.       Lumbar back: She exhibits tenderness, bony tenderness and pain. She exhibits no spasm and normal pulse.       Right foot:  She exhibits no swelling.       Left foot: She exhibits no swelling.       Bilateral lower extremities nontender without color change, baseline range of motion of extremities with intact distal pulses, capillary refill less than 2 seconds bilaterally.  Pt has increased pain w ROM of lumbar spine. Pain w ambulation, no sign of ataxia.  Neurological: She is alert and oriented to person, place, and time. She has normal strength and normal reflexes. No sensory deficit. Gait (no ataxia, slowed and hunched d/t pain ) abnormal.       Sensation at baseline for light touch in all 4 distal extremities, motor symmetric & bilateral 5/5 (hips: abduction, adduction, flexion; knee: flexion & extension; foot: dorsiflexion, plantar flexion, toes: dorsi flexion) Patellar & ankle reflexes intact.   Skin: Skin is warm and dry. No rash noted. She is not diaphoretic. No erythema. No pallor.  Psychiatric: She has a normal mood and affect.    ED Course  Procedures (including critical care time)  Labs Reviewed - No data to display No results found.   No diagnosis found.  Pt w nausea after percocet, gave zofran. Feeling better.   MDM  Low back pain, acute on chronic exacerbation  Patient with back pain.  No neurological deficits and normal neuro exam.  Patient can walk but states is painful.  No loss of bowel or bladder control.  No concern for cauda equina.  No fever, night sweats, weight loss, h/o cancer, IVDU.  RICE protocol and pain medicine indicated and discussed with patient.           Jaci Carrel, [place] 09/08/11 1513

## 2011-09-08 NOTE — ED Notes (Signed)
Pt has chronic back pain, Thursday was hit in back by child playing, child in [place] house and landed on her back. Pt uncomfortable in triage

## 2012-12-07 ENCOUNTER — Emergency Department (HOSPITAL_COMMUNITY): Payer: Self-pay

## 2012-12-07 ENCOUNTER — Encounter (HOSPITAL_COMMUNITY): Payer: Self-pay | Admitting: Emergency Medicine

## 2012-12-07 ENCOUNTER — Emergency Department (HOSPITAL_COMMUNITY)
Admission: EM | Admit: 2012-12-07 | Discharge: 2012-12-07 | Disposition: A | Discharge status: Home / Self Care | Payer: Self-pay | Attending: Emergency Medicine | Admitting: Emergency Medicine

## 2012-12-07 DIAGNOSIS — IMO0001 Reserved for inherently not codable concepts without codable children: Secondary | ICD-10-CM | POA: Insufficient documentation

## 2012-12-07 DIAGNOSIS — R110 Nausea: Secondary | ICD-10-CM | POA: Insufficient documentation

## 2012-12-07 DIAGNOSIS — M542 Cervicalgia: Secondary | ICD-10-CM | POA: Insufficient documentation

## 2012-12-07 DIAGNOSIS — M25519 Pain in unspecified shoulder: Secondary | ICD-10-CM | POA: Insufficient documentation

## 2012-12-07 DIAGNOSIS — G8929 Other chronic pain: Secondary | ICD-10-CM | POA: Insufficient documentation

## 2012-12-07 DIAGNOSIS — M25512 Pain in left shoulder: Secondary | ICD-10-CM

## 2012-12-07 DIAGNOSIS — R0789 Other chest pain: Secondary | ICD-10-CM | POA: Insufficient documentation

## 2012-12-07 DIAGNOSIS — I10 Essential (primary) hypertension: Secondary | ICD-10-CM | POA: Insufficient documentation

## 2012-12-07 DIAGNOSIS — M129 Arthropathy, unspecified: Secondary | ICD-10-CM | POA: Insufficient documentation

## 2012-12-07 LAB — BASIC METABOLIC PANEL
CO2: 27 mEq/L (ref 19–32)
Chloride: 103 mEq/L (ref 96–112)
Glucose, Bld: 98 mg/dL (ref 70–99)
Potassium: 3.6 mEq/L (ref 3.5–5.1)
Sodium: 142 mEq/L (ref 135–145)

## 2012-12-07 LAB — CBC WITH DIFFERENTIAL/PLATELET
Basophils Absolute: 0 10*3/uL (ref 0.0–0.1)
Lymphocytes Relative: 34 % (ref 12–46)
Lymphs Abs: 2.6 10*3/uL (ref 0.7–4.0)
MCV: 90.7 fL (ref 78.0–100.0)
Neutro Abs: 4.4 10*3/uL (ref 1.7–7.7)
Neutrophils Relative %: 58 % (ref 43–77)
Platelets: 208 10*3/uL (ref 150–400)
RBC: 4.53 MIL/uL (ref 3.87–5.11)
RDW: 12.2 % (ref 11.5–15.5)
WBC: 7.6 10*3/uL (ref 4.0–10.5)

## 2012-12-07 LAB — TROPONIN I: Troponin I: <0.3 ng/mL (ref <0.30)

## 2012-12-07 MED ORDER — HYDROCHLOROTHIAZIDE 25 MG PO TABS: 25.0000 mg | ORAL_TABLET | Freq: Every day | ORAL | Status: DC

## 2012-12-07 MED ORDER — OXYCODONE-ACETAMINOPHEN 5-325 MG PO TABS: 1.0000 | ORAL_TABLET | ORAL | Status: DC | PRN

## 2012-12-07 MED ORDER — HYDROMORPHONE HCL PF 1 MG/ML IJ SOLN
1.0000 mg | Freq: Once | INTRAMUSCULAR | Status: AC
  Administered 2012-12-07: 1 mg via INTRAVENOUS
  Filled 2012-12-07: qty 1

## 2012-12-07 NOTE — ED Notes (Addendum)
Left arm pain and shoulder pain bad  x 2 days has been going on x several months nauseated has not been taking her bp meds due to having no insurance

## 2012-12-07 NOTE — ED Provider Notes (Signed)
CSN: [id number]     Arrival date & time 12/07/12  1235 History   First MD Initiated Contact with Patient 12/07/12 1453     Chief Complaint  Patient presents with  . Arm Pain   (Consider location/radiation/quality/duration/timing/severity/associated sxs/prior Treatment) HPI Comments: 52 year old female presents with 2 months of left shoulder pain. States the pain has gotten worse over the past week to the point that she sought care in the ER. Denies any shortness of breath. She states sometimes she has nausea denied vomiting. No fevers. No weakness or numbness. Initially the pain was intermittent, worse only with using her arm but now it is hurting all the time. Has a history of hypertension postoperatively in her medicine for several months since she lost her Medicaid. His been taking ibuprofen for this but has not helped. States now the pain has moved into her chest as well. His feels like both a sharp and a dull pain that intermittently changes. Denies any known injury.   Past Medical History  Diagnosis Date  . Fibromyalgia   . Hypertension   . Arthritis   . Chronic back pain    Past Surgical History  Procedure Laterality Date  . Neck surgery     No family history on file. History  Substance Use Topics  . Smoking status: Never Smoker   . Smokeless tobacco: Not on file  . Alcohol Use: Yes   OB History   Grav Para Term Preterm Abortions TAB SAB Ect Mult Living                 Review of Systems  Constitutional: Negative for fever and chills.  Respiratory: Negative for shortness of breath.   Cardiovascular: Positive for chest pain.  Gastrointestinal: Positive for nausea. Negative for vomiting and abdominal pain.  Musculoskeletal: Positive for neck pain.  Neurological: Negative for weakness and numbness.  All other systems reviewed and are negative.    Allergies  Review of patient's allergies indicates no known allergies.  Home Medications   Current Outpatient Rx   Name  Route  Sig  Dispense  Refill  . acetaminophen (TYLENOL) 500 MG tablet   Oral   Take 1,500 mg by mouth every 6 (six) hours as needed for moderate pain.          BP 212/99  Pulse 82  Temp(Src) 99.1 F (37.3 C)  Resp 16  SpO2 99% Physical Exam  Nursing note and vitals reviewed. Constitutional: She is oriented to person, place, and time. She appears well-developed and well-nourished.  HENT:  Head: Normocephalic and atraumatic.  Right Ear: External ear normal.  Left Ear: External ear normal.  Nose: Nose normal.  Eyes: Right eye exhibits no discharge. Left eye exhibits no discharge.  Cardiovascular: Normal rate, regular rhythm, normal heart sounds and intact distal pulses.   Pulmonary/Chest: Effort normal and breath sounds normal. She exhibits tenderness (Left anterior chest).  Abdominal: Soft. She exhibits no distension. There is no tenderness.  Musculoskeletal:       Left shoulder: She exhibits decreased range of motion and tenderness. She exhibits normal pulse and normal strength.  Neurological: She is alert and oriented to person, place, and time. She has normal strength. No sensory deficit.  5/5 strength in all 4 extremities  Skin: Skin is warm and dry.    ED Course  Procedures (including critical care time) Labs Review Labs Reviewed  BASIC METABOLIC PANEL - Abnormal; Notable for the following:    GFR calc non Af Denyse Dago  76 (*)    GFR calc Af Amer 89 (*)    All other components within normal limits  CBC WITH DIFFERENTIAL  TROPONIN I   Imaging Review Dg Chest 2 View  12/07/2012   CLINICAL DATA:  Pain in left shoulder for 2 months, worse in the last week with no known injury  EXAM: CHEST  2 VIEW  COMPARISON:  07/15/2012  FINDINGS: Heart size and vascular pattern normal. Lungs clear. No pleural effusion. Status post multilevel anterior fixation lower cervical spine noted. Status post cholecystectomy noted.  IMPRESSION: No acute findings.   Electronically Signed   By:  Esperanza Heir M.D.   On: 12/07/2012 16:33   Dg Shoulder Left  12/07/2012   CLINICAL DATA:  Left shoulder pain for several months with no known injury  EXAM: LEFT SHOULDER - 2+ VIEW  COMPARISON:  None.  FINDINGS: Mild acromioclavicular joint degeneration. Glenohumeral joint shows no evidence of significant degenerative change. There is no fracture or dislocation.  IMPRESSION: No acute abnormalities.   Electronically Signed   By: Esperanza Heir M.D.   On: 12/07/2012 16:33    EKG Interpretation    Date/Time:  Wednesday December 07 2012 12:41:29 EST Ventricular Rate:  76 PR Interval:  154 QRS Duration: 76 QT Interval:  392 QTC Calculation: 441 R Axis:   49 Text Interpretation:  Normal sinus rhythm Normal ECG No significant change since last tracing Confirmed by Malayjah Otoole  MD, Aliegha Paullin ([id number]) on 12/07/2012 2:42:07 PM            MDM   1. Left shoulder pain   2. Hypertension    Patient's pain seems to be of musculoskeletal etiology. Low concern for ACS given the chronicity of her symptoms and significant pain with range of motion. She has a normal EKG and normal troponin as well. I did stress that her hypertension needs to be controlled she's been back on hypertension meds, will start her on HCTZ. She has a PCP and I recommended that she followup closely as possible with him. We'll give range of motion exercises for her shoulder and recommended close followup as well.    Audree Camel, MD 12/07/12 [phone]

## 2012-12-07 NOTE — ED Notes (Signed)
Pt will leave at 1723, IV pain med protocol

## 2013-01-07 ENCOUNTER — Encounter (HOSPITAL_COMMUNITY): Payer: Self-pay | Admitting: Emergency Medicine

## 2013-01-07 ENCOUNTER — Emergency Department (HOSPITAL_COMMUNITY): Payer: Self-pay

## 2013-01-07 ENCOUNTER — Emergency Department (HOSPITAL_COMMUNITY)
Admission: EM | Admit: 2013-01-07 | Discharge: 2013-01-07 | Disposition: A | Discharge status: Home / Self Care | Payer: Self-pay | Attending: Emergency Medicine | Admitting: Emergency Medicine

## 2013-01-07 DIAGNOSIS — IMO0001 Reserved for inherently not codable concepts without codable children: Secondary | ICD-10-CM | POA: Insufficient documentation

## 2013-01-07 DIAGNOSIS — R197 Diarrhea, unspecified: Secondary | ICD-10-CM | POA: Insufficient documentation

## 2013-01-07 DIAGNOSIS — J069 Acute upper respiratory infection, unspecified: Secondary | ICD-10-CM | POA: Insufficient documentation

## 2013-01-07 DIAGNOSIS — R0789 Other chest pain: Secondary | ICD-10-CM | POA: Insufficient documentation

## 2013-01-07 DIAGNOSIS — R112 Nausea with vomiting, unspecified: Secondary | ICD-10-CM | POA: Insufficient documentation

## 2013-01-07 DIAGNOSIS — B9789 Other viral agents as the cause of diseases classified elsewhere: Secondary | ICD-10-CM | POA: Insufficient documentation

## 2013-01-07 DIAGNOSIS — B349 Viral infection, unspecified: Secondary | ICD-10-CM

## 2013-01-07 DIAGNOSIS — Z79899 Other long term (current) drug therapy: Secondary | ICD-10-CM | POA: Insufficient documentation

## 2013-01-07 DIAGNOSIS — G8929 Other chronic pain: Secondary | ICD-10-CM | POA: Insufficient documentation

## 2013-01-07 DIAGNOSIS — M129 Arthropathy, unspecified: Secondary | ICD-10-CM | POA: Insufficient documentation

## 2013-01-07 DIAGNOSIS — R21 Rash and other nonspecific skin eruption: Secondary | ICD-10-CM | POA: Insufficient documentation

## 2013-01-07 DIAGNOSIS — I10 Essential (primary) hypertension: Secondary | ICD-10-CM | POA: Insufficient documentation

## 2013-01-07 LAB — COMPREHENSIVE METABOLIC PANEL
ALT: 26 U/L (ref 0–35)
BUN: 18 mg/dL (ref 6–23)
Calcium: 9.3 mg/dL (ref 8.4–10.5)
GFR calc Af Amer: 69 mL/min — ABNORMAL LOW (ref >90)
Glucose, Bld: 98 mg/dL (ref 70–99)
Sodium: 137 mEq/L (ref 135–145)
Total Protein: 8.1 g/dL (ref 6.0–8.3)

## 2013-01-07 LAB — CBC WITH DIFFERENTIAL/PLATELET
Basophils Absolute: 0 10*3/uL (ref 0.0–0.1)
Basophils Relative: 0 % (ref 0–1)
Eosinophils Absolute: 0.2 10*3/uL (ref 0.0–0.7)
Eosinophils Relative: 2 % (ref 0–5)
Lymphs Abs: 3.2 10*3/uL (ref 0.7–4.0)
MCH: 32.3 pg (ref 26.0–34.0)
MCHC: 35 g/dL (ref 30.0–36.0)
MCV: 92.2 fL (ref 78.0–100.0)
Monocytes Absolute: 0.7 10*3/uL (ref 0.1–1.0)
Platelets: 181 10*3/uL (ref 150–400)
RDW: 12.5 % (ref 11.5–15.5)

## 2013-01-07 LAB — TROPONIN I: Troponin I: <0.3 ng/mL (ref <0.30)

## 2013-01-07 MED ORDER — BENZONATATE 100 MG PO CAPS: 100.0000 mg | ORAL_CAPSULE | Freq: Three times a day (TID) | ORAL | Status: DC

## 2013-01-07 MED ORDER — ACYCLOVIR 400 MG PO TABS: 400.0000 mg | ORAL_TABLET | ORAL | Status: DC

## 2013-01-07 MED ORDER — ACETAMINOPHEN 325 MG PO TABS
650.0000 mg | ORAL_TABLET | Freq: Once | ORAL | Status: AC
  Administered 2013-01-07: 650 mg via ORAL
  Filled 2013-01-07: qty 2

## 2013-01-07 MED ORDER — SODIUM CHLORIDE 0.9 % IV BOLUS (SEPSIS)
1000.0000 mL | Freq: Once | INTRAVENOUS | Status: AC
  Administered 2013-01-07: 1000 mL via INTRAVENOUS

## 2013-01-07 MED ORDER — ACYCLOVIR 400 MG PO TABS: 400.0000 mg | ORAL_TABLET | Freq: Every day | ORAL | Status: DC

## 2013-01-07 NOTE — ED Provider Notes (Signed)
cmet returned and is normal.  Pt discharged stable   Lonia Skinner [place], [place] 01/07/13 1731

## 2013-01-07 NOTE — ED Notes (Signed)
Patient tried to urinate but couldnt 

## 2013-01-07 NOTE — ED Notes (Signed)
Pt from home c/o flu-like symptoms x1 week. Pt reports cough, fever, sore throat, body aches, unable to eat/drink. Pt is A&O and in NAD

## 2013-01-07 NOTE — ED Provider Notes (Signed)
CSN: [id number]     Arrival date & time 01/07/13  1304 History   First MD Initiated Contact with Patient 01/07/13 1410     Chief Complaint  Patient presents with  . Cough  . Sore Throat  . Fever   (Consider location/radiation/quality/duration/timing/severity/associated sxs/prior Treatment) The history is provided by the patient. No language interpreter was used.  [name] [name] is a 52 y/o F with PMHx of HTN, arthritis, fibromyalgia, chronic back pain presenting to the ED with sore throat, bodyaches, fever, cough, nasal congestion that has been ongoing for the past week. Patient reported that the sore throat is worse with swallowing food. Reported that she has been coughing up green sputum. Stated that rhinorrhea is a yellow/greenish color. Reported that she has been using Ibuprofen and Theraflu OTC with minimal relief. Stated that she has been having difficulty breathing, secondary to congestion. Patient reported that she has been experiencing chest tightness, only associated with cough. Stated that she had fever intermittently, Tmax of 102.6 degrees Fahrenheit. Reported diarrhea, nausea, emesis - stated that she has had 3 episodes of emesis - mainly of phlegm and food - NB/NB. Reported that father was diagnosed with flu last week and that she was around him. Denied flu vaccine. Denied facial pressure, difficulty swallowing, urinary issues, dysuria, hematuria, ear pain, numbness, tingling, hemoptysis, travel.  PCP none   Past Medical History  Diagnosis Date  . Fibromyalgia   . Hypertension   . Arthritis   . Chronic back pain    Past Surgical History  Procedure Laterality Date  . Neck surgery     No family history on file. History  Substance Use Topics  . Smoking status: Never Smoker   . Smokeless tobacco: Not on file  . Alcohol Use: Yes   OB History   Grav Para Term Preterm Abortions TAB SAB Ect Mult Living                 Review of Systems  Constitutional: Positive for  fever and chills.  HENT: Positive for congestion and sore throat. Negative for trouble swallowing.   Respiratory: Positive for cough and chest tightness.   Cardiovascular: Negative for chest pain.  Gastrointestinal: Positive for nausea, vomiting and diarrhea. Negative for abdominal pain, blood in stool and anal bleeding.  Musculoskeletal: Positive for myalgias.  Neurological: Negative for dizziness and weakness.  All other systems reviewed and are negative.    Allergies  Review of patient's allergies indicates no known allergies.  Home Medications   Current Outpatient Rx  Name  Route  Sig  Dispense  Refill  . acetaminophen (TYLENOL) 500 MG tablet   Oral   Take 1,500 mg by mouth every 6 (six) hours as needed for moderate pain.         . Acetaminophen-Guaifenesin (THERAFLU FLU/CHEST CONGESTION PO)   Oral   Take 1 Package by mouth daily.         . hydrochlorothiazide (HYDRODIURIL) 25 MG tablet   Oral   Take 1 tablet (25 mg total) by mouth daily.   30 tablet   0    BP 132/83  Pulse 85  Temp(Src) 99.7 F (37.6 C) (Oral)  Resp 20  SpO2 98% Physical Exam  Nursing note and vitals reviewed. Constitutional: She is oriented to person, place, and time. She appears well-developed and well-nourished. No distress.  HENT:  Head: Normocephalic and atraumatic.  Mouth/Throat: Oropharynx is clear and moist. No oropharyngeal exudate.  Negative swelling, erythema, inflammation, lesions, sores,  petechiae, exudate noted to the posterior oropharynx and tonsils bilaterally. Negative uvula swelling - uvula midline, symmetrical elevation.   Eyes: Conjunctivae and EOM are normal. Pupils are equal, round, and reactive to light. Right eye exhibits no discharge. Left eye exhibits no discharge.  Neck: Normal range of motion. Neck supple.  Negative neck stiffness Negative nuchal rigidity Negative cervical LAD Negative pain upon palpation to the neck  Negative meningeal signs  Cardiovascular:  Normal rate, regular rhythm and normal heart sounds.  Exam reveals no friction rub.   No murmur heard. Pulses:      Radial pulses are 2+ on the right side, and 2+ on the left side.       Dorsalis pedis pulses are 2+ on the right side, and 2+ on the left side.  Pulmonary/Chest: Effort normal and breath sounds normal. No respiratory distress. She has no wheezes. She has no rales. She exhibits no tenderness.  Negative respiratory distress Patient is able to speak in full sentences without difficulty  Negative use of accessory muscles  Abdominal: Soft. Bowel sounds are normal. There is no tenderness. There is no guarding.  Musculoskeletal: Normal range of motion.  Full ROM to upper and lower extremities without difficulty noted, negative ataxia noted  Lymphadenopathy:    She has no cervical adenopathy.  Neurological: She is alert and oriented to person, place, and time. She exhibits normal muscle tone. Coordination normal.  Cranial nerves III-XII grossly intact Strength 5+/5+ to upper and lower extremities bilaterally with resistance applied, equal distribution noted  Skin: Skin is warm. Rash noted. She is not diaphoretic.  Fluid filled vesicle with erythematous halo noted underneath left breast. Negative pain upon palpation. Negative active drainage noted. Blanchable blotches localized on the abdomen on the left side.  Psychiatric: She has a normal mood and affect. Her behavior is normal. Thought content normal.    ED Course  Procedures (including critical care time)  Results for orders placed during the hospital encounter of 01/07/13  RAPID STREP SCREEN      Result Value Range   Streptococcus, Group A Screen (Direct) NEGATIVE  NEGATIVE  CBC WITH DIFFERENTIAL      Result Value Range   WBC 9.7  4.0 - 10.5 K/uL   RBC 4.49  3.87 - 5.11 MIL/uL   Hemoglobin 14.5  12.0 - 15.0 g/dL   HCT 16.1  09.6 - 04.5 %   MCV 92.2  78.0 - 100.0 fL   MCH 32.3  26.0 - 34.0 pg   MCHC 35.0  30.0 - 36.0  g/dL   RDW 40.9  81.1 - 91.4 %   Platelets 181  150 - 400 K/uL   Neutrophils Relative % 58  43 - 77 %   Neutro Abs 5.6  1.7 - 7.7 K/uL   Lymphocytes Relative 33  12 - 46 %   Lymphs Abs 3.2  0.7 - 4.0 K/uL   Monocytes Relative 8  3 - 12 %   Monocytes Absolute 0.7  0.1 - 1.0 K/uL   Eosinophils Relative 2  0 - 5 %   Eosinophils Absolute 0.2  0.0 - 0.7 K/uL   Basophils Relative 0  0 - 1 %   Basophils Absolute 0.0  0.0 - 0.1 K/uL  COMPREHENSIVE METABOLIC PANEL      Result Value Range   Sodium 137  135 - 145 mEq/L   Potassium 3.8  3.5 - 5.1 mEq/L   Chloride 99  96 - 112 mEq/L   CO2  26  19 - 32 mEq/L   Glucose, Bld 98  70 - 99 mg/dL   BUN 18  6 - 23 mg/dL   Creatinine, Ser 1.07  0.50 - 1.10 mg/dL   Calcium 9.3  8.4 - 10.5 mg/dL   Total Protein 8.1  6.0 - 8.3 g/dL   Albumin 4.1  3.5 - 5.2 g/dL   AST 24  0 - 37 U/L   ALT 26  0 - 35 U/L   Alkaline Phosphatase 64  39 - 117 U/L   Total Bilirubin 0.7  0.3 - 1.2 mg/dL   GFR calc non Af Amer 59 (*) >90 mL/min   GFR calc Af Amer 69 (*) >90 mL/min  TROPONIN I      Result Value Range   Troponin I <0.30  <0.30 ng/mL   Dg Chest 2 View  01/07/2013   CLINICAL DATA:  Three-day history of cough, fever, and sore throat.  EXAM: CHEST  2 VIEW  COMPARISON:  PA and lateral chest x-ray of December 07, 2012.  FINDINGS: The lungs are adequately inflated. There is no focal infiltrate. The cardiopericardial silhouette is normal in size. The pulmonary vascularity is not engorged. The mediastinum is normal in width. There is no pleural effusion. The trachea is midline. The observed portions of the bony thorax are normal.  IMPRESSION: There is no evidence of pneumonia nor CHF nor other acute cardiopulmonary abnormality.   Electronically Signed   By: David  Martinique   On: 01/07/2013 15:02     Labs Review Labs Reviewed  RAPID STREP SCREEN  CULTURE, GROUP A STREP  CBC WITH DIFFERENTIAL  TROPONIN I  COMPREHENSIVE METABOLIC PANEL  URINALYSIS, ROUTINE W REFLEX  MICROSCOPIC  PREGNANCY, URINE   Imaging Review Dg Chest 2 View  01/07/2013   CLINICAL DATA:  Three-day history of cough, fever, and sore throat.  EXAM: CHEST  2 VIEW  COMPARISON:  PA and lateral chest x-ray of December 07, 2012.  FINDINGS: The lungs are adequately inflated. There is no focal infiltrate. The cardiopericardial silhouette is normal in size. The pulmonary vascularity is not engorged. The mediastinum is normal in width. There is no pleural effusion. The trachea is midline. The observed portions of the bony thorax are normal.  IMPRESSION: There is no evidence of pneumonia nor CHF nor other acute cardiopulmonary abnormality.   Electronically Signed   By: David  Martinique   On: 01/07/2013 15:02    EKG Interpretation    Date/Time:  Saturday January 07 2013 15:24:49 EST Ventricular Rate:  81 PR Interval:  145 QRS Duration: 81 QT Interval:  383 QTC Calculation: 445 R Axis:   46 Text Interpretation:  Sinus rhythm since last tracing no significant change Confirmed by WENTZ  MD, ELLIOTT ([id number]) on 01/07/2013 4:04:02 PM            MDM  No diagnosis found.  Medications  sodium chloride 0.9 % bolus 1,000 mL (1,000 mLs Intravenous New Bag/Given 01/07/13 1517)  acetaminophen (TYLENOL) tablet 650 mg (650 mg Oral Given 01/07/13 1650)  sodium chloride 0.9 % bolus 1,000 mL (1,000 mLs Intravenous New Bag/Given 01/07/13 1708)   Filed Vitals:   01/07/13 1307  BP: 132/83  Pulse: 85  Temp: 99.7 F (37.6 C)  TempSrc: Oral  Resp: 20  SpO2: 98%    Patient presenting to the ED with nasal congestion, productive cough, rhinorrhea, chest tightness that worsens with coughing, generalized myalgias, fever, chills. Reported that father was diagnosed with flu last week.  Denied flu vaccine.  Alert and oriented. GCS 15. Heart rate and rhythm normal. Lungs clear to auscultation to upper and lower lobes. Pulses palpable and strong, radial and DP 2+ bilaterally. Full ROM to upper and lower  extremities bilaterally. Negative pain upon palpation to the chest wall. BS normoactive in all quadrants, soft, nontender - negative acute abdomen, negative peritoneal signs. Dry cough noted upon exam. Negative neck stiffness, negative meningeal signs. Negative pain upon palpation to the neck. Nasal congestion noted. Strength intact with equal distribution. Fluid filled vesicle with erythematous base identified underneath the left breast. Blanchable blotches localized on the abdomen on the left side.  EKG negative ischemic findings. Troponin negative elevation. Chest xray negative. CBC negative elevated WBC, negative left shift. Rapid strep negative. CMP negative findings. Rapid strep negative. Doubt pneumonia. Doubt cardiac nature. Suspicion to be viral in nature, URI. Rash - doubt scabies, doubt SJS, doubt erythema multiforme major and minor - suspicion to be possible beginnings of shingles, cannot rule out dermatitis. Discharge paperwork complete with referral to Urgent Care Center. Discussed supportive therapy. Acyclovir prescribed if rash worsens - guideline given. Medications for cough administered. Discussed case with Langston Masker, PA-C. Transfer of care to Langston Masker, PA-C at change in shift.     Raymon Mutton, PA-C 01/07/13 1733

## 2013-01-08 NOTE — ED Provider Notes (Signed)
Medical screening examination/treatment/procedure(s) were performed by non-physician practitioner and as supervising physician I was immediately available for consultation/collaboration.  Lynisha Osuch L Icesis Renn, MD 01/08/13 0038 

## 2013-01-09 LAB — CULTURE, GROUP A STREP

## 2013-01-10 NOTE — ED Provider Notes (Signed)
Medical screening examination/treatment/procedure(s) were performed by non-physician practitioner and as supervising physician I was immediately available for consultation/collaboration.  EKG Interpretation    Date/Time:  Saturday January 07 2013 15:24:49 EST Ventricular Rate:  81 PR Interval:  145 QRS Duration: 81 QT Interval:  383 QTC Calculation: 445 R Axis:   46 Text Interpretation:  Sinus rhythm since last tracing no significant change Confirmed by Effie Shy  MD, ELLIOTT ([id number]) on 01/07/2013 4:04:02 PM              Celene Kras, MD 01/10/13 [phone]

## 2013-04-21 ENCOUNTER — Emergency Department (HOSPITAL_COMMUNITY)
Admission: EM | Admit: 2013-04-21 | Discharge: 2013-04-21 | Disposition: A | Discharge status: Home / Self Care | Payer: Medicaid Other | Attending: Emergency Medicine | Admitting: Emergency Medicine

## 2013-04-21 ENCOUNTER — Encounter (HOSPITAL_COMMUNITY): Payer: Self-pay | Admitting: Emergency Medicine

## 2013-04-21 DIAGNOSIS — Y939 Activity, unspecified: Secondary | ICD-10-CM | POA: Insufficient documentation

## 2013-04-21 DIAGNOSIS — S335XXA Sprain of ligaments of lumbar spine, initial encounter: Secondary | ICD-10-CM | POA: Insufficient documentation

## 2013-04-21 DIAGNOSIS — M129 Arthropathy, unspecified: Secondary | ICD-10-CM | POA: Insufficient documentation

## 2013-04-21 DIAGNOSIS — G8929 Other chronic pain: Secondary | ICD-10-CM | POA: Insufficient documentation

## 2013-04-21 DIAGNOSIS — IMO0001 Reserved for inherently not codable concepts without codable children: Secondary | ICD-10-CM | POA: Insufficient documentation

## 2013-04-21 DIAGNOSIS — X58XXXA Exposure to other specified factors, initial encounter: Secondary | ICD-10-CM | POA: Insufficient documentation

## 2013-04-21 DIAGNOSIS — S39012A Strain of muscle, fascia and tendon of lower back, initial encounter: Secondary | ICD-10-CM

## 2013-04-21 DIAGNOSIS — I10 Essential (primary) hypertension: Secondary | ICD-10-CM | POA: Insufficient documentation

## 2013-04-21 DIAGNOSIS — Y929 Unspecified place or not applicable: Secondary | ICD-10-CM | POA: Insufficient documentation

## 2013-04-21 MED ORDER — HYDROCODONE-ACETAMINOPHEN 5-325 MG PO TABS
1.0000 | ORAL_TABLET | Freq: Once | ORAL | Status: AC
  Administered 2013-04-21: 1 via ORAL
  Filled 2013-04-21: qty 1

## 2013-04-21 MED ORDER — METHOCARBAMOL 500 MG PO TABS: 500.0000 mg | ORAL_TABLET | Freq: Two times a day (BID) | ORAL | Status: DC

## 2013-04-21 MED ORDER — METHOCARBAMOL 500 MG PO TABS
500.0000 mg | ORAL_TABLET | Freq: Once | ORAL | Status: AC
  Administered 2013-04-21: 500 mg via ORAL
  Filled 2013-04-21: qty 1

## 2013-04-21 MED ORDER — HYDROCODONE-ACETAMINOPHEN 5-325 MG PO TABS: 2.0000 | ORAL_TABLET | ORAL | Status: DC | PRN

## 2013-04-21 NOTE — ED Notes (Signed)
Pt presents with back pain X 2 days with an increase in intensity X 1 day. Pt states "they say I have a bulging disk and I have a rod in my back" Pt states that the pain radiates into her lower abd. Condition is made better by nothing. Condition is made worse "when I try and poop" pt states she has had similar episodes in the past. Pt is ambulatory at the time of triage.

## 2013-04-21 NOTE — ED Provider Notes (Signed)
CSN: [id number]     Arrival date & time 04/21/13  1640 History  This chart was scribed for Fayrene HelperBowie Rochell Puett, PA-C, working with Junius ArgyleForrest S Harrison, MD, by [facility] ED Scribe. This patient was seen in room TR10C/TR10C and the patient's care was started at 6:13 PM.   Chief Complaint  Patient presents with  . Back Pain    The history is provided by the patient. No language interpreter was used.    HPI Comments: Carolyne FiscalBelinda A Mcbreen is a 53 y.o. female who presents to the Emergency Department complaining of intermittent "sharp, stabbing, burning" lower back pain that radiates to her abdomen onset yesterday, which worsened this morning when she awoke from sleep. She states that her pain is worsened with any movement. She states that she did some lifting earlier in the week, which she believes may have contributed to her pain. She states that she has taken Ibuprofen and Tylenol without relief. She also states that she has applied heat without relief. She states that she has a history of similar back pain in the past, but not for "a while". She denies fever, chills, bowel or bladder incontinence, rash, hematuria, new numbness or any other symptoms.   Past Medical History  Diagnosis Date  . Fibromyalgia   . Hypertension   . Arthritis   . Chronic back pain    Past Surgical History  Procedure Laterality Date  . Neck surgery     History reviewed. No pertinent family history. History  Substance Use Topics  . Smoking status: Never Smoker   . Smokeless tobacco: Not on file  . Alcohol Use: Yes   OB History   Grav Para Term Preterm Abortions TAB SAB Ect Mult Living                 Review of Systems  Constitutional: Negative for fever and chills.  Gastrointestinal:       Denies bowel incontinence  Genitourinary: Negative for hematuria.       Denies bladder incontinence  Musculoskeletal: Positive for back pain.  Skin: Negative for rash.  Neurological: Negative for weakness and numbness.  All  other systems reviewed and are negative.   Allergies  Review of patient's allergies indicates no known allergies.  Home Medications   Current Outpatient Rx  Name  Route  Sig  Dispense  Refill  . acetaminophen (TYLENOL) 500 MG tablet   Oral   Take 1,500 mg by mouth every 6 (six) hours as needed for moderate pain.         . cyclobenzaprine (FLEXERIL) 10 MG tablet   Oral   Take 10 mg by mouth daily as needed for muscle spasms.         . fluticasone (FLONASE) 50 MCG/ACT nasal spray   Each Nare   Place 2 sprays into both nostrils daily as needed for allergies or rhinitis.          Triage Vitals: BP 142/85  Pulse 83  Temp(Src) 98.3 F (36.8 C) (Oral)  Resp 16  Ht 5\' 2"  (1.575 m)  Wt 165 lb (74.844 kg)  BMI 30.17 kg/m2  SpO2 99%  Physical Exam  Nursing note and vitals reviewed. Constitutional: She is oriented to person, place, and time. She appears well-developed and well-nourished. No distress.  HENT:  Head: Normocephalic and atraumatic.  Eyes: EOM are normal.  Neck: Neck supple. No tracheal deviation present.  Cardiovascular: Normal rate and intact distal pulses.   Intact DP pulses to bilateral feet.  Brisk cap refill.   Pulmonary/Chest: Effort normal. No respiratory distress.  Abdominal: Soft. Bowel sounds are normal. There is no tenderness.  Musculoskeletal: Normal range of motion. She exhibits tenderness. She exhibits no edema.  Tenderness to palpation of lumbar spine and paralumbar spine.  Neurological: She is alert and oriented to person, place, and time. She has normal reflexes.  Skin: Skin is warm and dry.  Normal skin.  Psychiatric: She has a normal mood and affect. Her behavior is normal.    ED Course  Procedures (including critical care time)  DIAGNOSTIC STUDIES: Oxygen Saturation is 99% on RA, normal by my interpretation.    COORDINATION OF CARE: 6:16 PM- Discussed that pt's pain is likely a delayed reaction from her lifting earlier in the week.  Discussed that radiology is not needed and pt agrees. No red flags. Pt able to ambulate.  Is NVI.   Advised pt to continue applying heat. Will order pain medication in the ED. Pt advised of plan for treatment and pt agrees.  Medications  HYDROcodone-acetaminophen (NORCO/VICODIN) 5-325 MG per tablet 1 tablet (not administered)  methocarbamol (ROBAXIN) tablet 500 mg (not administered)   Labs Review Labs Reviewed - No data to display Imaging Review No results found.   EKG Interpretation None      MDM   Final diagnoses:  Strain of lumbar paraspinal muscle    BP 142/85  Pulse 83  Temp(Src) 98.3 F (36.8 C) (Oral)  Resp 16  Ht 5\' 2"  (1.575 m)  Wt 165 lb (74.844 kg)  BMI 30.17 kg/m2  SpO2 99%   I personally performed the services described in this documentation, which was scribed in my presence. The recorded information has been reviewed and is accurate.    Fayrene Helper, PA-C 04/21/13 1859  Fayrene Helper, PA-C 04/21/13 1859

## 2013-04-21 NOTE — Discharge Instructions (Signed)

## 2013-04-22 NOTE — ED Provider Notes (Signed)
Medical screening examination/treatment/procedure(s) were performed by non-physician practitioner and as supervising physician I was immediately available for consultation/collaboration.   EKG Interpretation None        Junius ArgyleForrest S Vidhi Delellis, MD 04/22/13 1142

## 2013-06-22 ENCOUNTER — Encounter (HOSPITAL_COMMUNITY): Payer: Self-pay | Admitting: Emergency Medicine

## 2013-06-22 ENCOUNTER — Emergency Department (HOSPITAL_COMMUNITY)
Admission: EM | Admit: 2013-06-22 | Discharge: 2013-06-22 | Disposition: A | Discharge status: Home / Self Care | Payer: Medicaid Other | Attending: Emergency Medicine | Admitting: Emergency Medicine

## 2013-06-22 DIAGNOSIS — G8929 Other chronic pain: Secondary | ICD-10-CM | POA: Insufficient documentation

## 2013-06-22 DIAGNOSIS — I10 Essential (primary) hypertension: Secondary | ICD-10-CM | POA: Insufficient documentation

## 2013-06-22 DIAGNOSIS — Z8739 Personal history of other diseases of the musculoskeletal system and connective tissue: Secondary | ICD-10-CM | POA: Insufficient documentation

## 2013-06-22 DIAGNOSIS — R209 Unspecified disturbances of skin sensation: Secondary | ICD-10-CM | POA: Insufficient documentation

## 2013-06-22 DIAGNOSIS — M542 Cervicalgia: Secondary | ICD-10-CM

## 2013-06-22 MED ORDER — DIAZEPAM 5 MG PO TABS
5.0000 mg | ORAL_TABLET | Freq: Once | ORAL | Status: AC
  Administered 2013-06-22: 5 mg via ORAL
  Filled 2013-06-22: qty 1

## 2013-06-22 MED ORDER — ONDANSETRON 4 MG PO TBDP
4.0000 mg | ORAL_TABLET | Freq: Once | ORAL | Status: AC
  Administered 2013-06-22: 4 mg via ORAL
  Filled 2013-06-22: qty 1

## 2013-06-22 MED ORDER — HYDROMORPHONE HCL PF 1 MG/ML IJ SOLN
1.0000 mg | Freq: Once | INTRAMUSCULAR | Status: AC
  Administered 2013-06-22: 1 mg via INTRAMUSCULAR
  Filled 2013-06-22: qty 1

## 2013-06-22 MED ORDER — HYDROCODONE-ACETAMINOPHEN 5-325 MG PO TABS: 1.0000 | ORAL_TABLET | Freq: Four times a day (QID) | ORAL | Status: DC | PRN

## 2013-06-22 MED ORDER — DIAZEPAM 5 MG PO TABS: 5.0000 mg | ORAL_TABLET | Freq: Three times a day (TID) | ORAL | Status: DC | PRN

## 2013-06-22 NOTE — ED Notes (Signed)
Pt has rod placed in neck in 2006. Has flare up pain at times.  Pt starts at neck and radiates down arm - causing numbness and to tailbone.  Pt states pain has been there for 2 weeks but has gotten worse.  Pt states she cannot rotate head down as per normal and that she has a loss of appetite d/t pain.

## 2013-06-22 NOTE — ED Notes (Signed)
Pt called out and has had an episode of emesis.  Dr Gwendolyn Grant made aware.

## 2013-06-22 NOTE — Discharge Instructions (Signed)
Musculoskeletal Pain °Musculoskeletal pain is muscle and boney aches and pains. These pains can occur in any part of the body. Your caregiver may treat you without knowing the cause of the pain. They may treat you if blood or urine tests, X-rays, and other tests were normal.  °CAUSES °There is often not a definite cause or reason for these pains. These pains may be caused by a type of germ (virus). The discomfort may also come from overuse. Overuse includes working out too hard when your body is not fit. Boney aches also come from weather changes. Bone is sensitive to atmospheric pressure changes. °HOME CARE INSTRUCTIONS  °· Ask when your test results will be ready. Make sure you get your test results. °· Only take over-the-counter or prescription medicines for pain, discomfort, or fever as directed by your caregiver. If you were given medications for your condition, do not drive, operate machinery or power tools, or sign legal documents for 24 hours. Do not drink alcohol. Do not take sleeping pills or other medications that may interfere with treatment. °· Continue all activities unless the activities cause more pain. When the pain lessens, slowly resume normal activities. Gradually increase the intensity and duration of the activities or exercise. °· During periods of severe pain, bed rest may be helpful. Lay or sit in any position that is comfortable. °· Putting ice on the injured area. °· Put ice in a bag. °· Place a towel between your skin and the bag. °· Leave the ice on for 15 to 20 minutes, 3 to 4 times a day. °· Follow up with your caregiver for continued problems and no reason can be found for the pain. If the pain becomes worse or does not go away, it may be necessary to repeat tests or do additional testing. Your caregiver may need to look further for a possible cause. °SEEK IMMEDIATE MEDICAL CARE IF: °· You have pain that is getting worse and is not relieved by medications. °· You develop chest pain  that is associated with shortness or breath, sweating, feeling sick to your stomach (nauseous), or throw up (vomit). °· Your pain becomes localized to the abdomen. °· You develop any new symptoms that seem different or that concern you. °MAKE SURE YOU:  °· Understand these instructions. °· Will watch your condition. °· Will get help right away if you are not doing well or get worse. °Document Released: 01/05/2005 Document Revised: 03/30/2011 Document Reviewed: 09/09/2012 °ExitCare® Patient Information ©2014 [employer]. ° ° °Emergency Department Resource Guide °1) Find a Doctor and Pay Out of Pocket °Although you won't have to find out who is covered by your insurance plan, it is a good idea to ask around and get recommendations. You will then need to call the office and see if the doctor you have chosen will accept you as a new patient and what types of options they offer for patients who are self-pay. Some doctors offer discounts or will set up payment plans for their patients who do not have insurance, but you will need to ask so you aren't surprised when you get to your appointment. ° °2) Contact Your Local Health Department °Not all health departments have doctors that can see patients for sick visits, but many do, so it is worth a call to see if yours does. If you don't know where your local health department is, you can check in your phone book. The CDC also has a tool to help you locate your state's   health department, and many state websites also have listings of all of their local health departments. ° °3) Find a Walk-in Clinic °If your illness is not likely to be very severe or complicated, you may want to try a walk in clinic. These are popping up all over the country in pharmacies, drugstores, and shopping centers. They're usually staffed by nurse practitioners or physician assistants that have been trained to treat common illnesses and complaints. They're usually fairly quick and inexpensive. However,  if you have serious medical issues or chronic medical problems, these are probably not your best option. ° °No Primary Care Doctor: °- Call [facility] at  832-8000 - they can help you locate a primary care doctor that  accepts your insurance, provides certain services, etc. °- Physician Referral Service- 1-800-533-3463 ° °Chronic Pain Problems: °Organization         Address  Phone   Notes  °[facility]  (336) 297-2271 Patients need to be referred by their primary care doctor.  ° °Medication Assistance: °Organization         Address  Phone   Notes  °[place] [facility] [address] °[place], [place] [address] (336) 641-8030 --Must be a resident of [place] °-- Must have NO insurance coverage whatsoever (no Medicaid/ Medicare, etc.) °-- The pt. MUST have a primary care doctor that directs their care regularly and follows them in the community °  °[facility]  (866) 331-1348   °[employer]  (888) 892-1162   ° °Agencies that provide inexpensive medical care: °Organization         Address  Phone   Notes  °[facility]  (336) 832-8035   °[facility]    (336) 832-7272   °[facility] [address] °[place], [place] [address] (336) 832-4777   °[facility] [address], °[place] (336) 271-4999   °[facility]    (336) 373-0678   °[facility]    (336) 272-1050   °[facility] ° [address], [place] Phone:  (336) 832-4444, Fax:  (336) 832-4440 Hours of Operation:  9 am - 6 pm, M-F.  Also accepts Medicaid/Medicare and self-pay.  °[facility] ° [address], [place] Phone: (336) 832-3150, Fax: (336) 832-3151. Hours of Operation:  8:30 am - 5:30 pm, M-F.  Also accepts Medicaid and self-pay.  °[facility] [place] [address], [place] Phone: (336) 878-6027   °[facility] [address], [place], [place] (336)723-1848, Ext. 123 Mondays & Thursdays: 7-9 AM.  First 15 patients are seen on a first come, first serve basis. °  ° °Medicaid-accepting [place] Providers: ° °Organization         Address  Phone   Notes  °[facility] [address], [place] (336) 641-2100 Also accepts self-pay patients.  °[facility] [address], [place] ° (336) 856-9996   °[facility] [address] [place] [address], [place] (336) 288-8857   °[facility] [address] [place] [address], [place] (336) 299-7000   °Veita Bland [address],   ° (336) 373-1557 Only accepts [place] Access Medicaid patients after they have their name applied to their card.  ° °Self-Pay (no insurance) in [place]: ° °Organization           Address  Phone   Notes  °Sickle Cell Patients, [facility] [address], [place] (336) 832-1970   °[facility] [address], [place] (336) 832-4400   °[facility] [place] ° [address], [place] (336) 992-4800   °[facility]/Dr. Osei-Bonsu ° [address] [place] [address], [place] or [address], [place] (336) 841-8500 Phone number for both [place] and [place] locations is the same.  °[facility] [address], [place] (336) 299-0000   °[facility] [address] [place] [address], [place] or [address] (336) 852-7530 °(336) 878-2260   °[facility] [address], [place] (336) 350-1642, phone; (336) 294-5005, fax Sees patients 1st and 3rd Saturday of every month.  Must not qualify for public or private insurance (i.e. Medicaid, Medicare, [place] Health Choice, Veterans' Benefits) • Household income should be no more than 200% of the poverty level •The clinic cannot treat you if you are pregnant or think you are  pregnant • Sexually transmitted diseases are not treated at the clinic.  ° ° °Dental Care: °Organization         Address  Phone  Notes  °[facility] [address], [place] (336) 641-6152 Accepts children up to age 21 who are enrolled in Medicaid or [place] Health Choice; pregnant women with a Medicaid card; and children who have applied for Medicaid or [place] Health Choice, but were declined, whose parents can pay a reduced fee at time of service.  °[facility] [place]  [address], [place] (336) 641-7733 Accepts children up to age 21 who are enrolled in Medicaid or [place] Health Choice; pregnant women with a Medicaid card; and children who have applied for Medicaid or [place] Health Choice, but were declined, whose parents can pay a reduced fee at time of service.  °[facility] ° [address], [place] (336) 641-4533 Patients are seen by appointment only. Walk-ins are not accepted. [facility] will see patients 18 years of age and older. °Monday - Tuesday (8am-5pm) °Most Wednesdays (8:30-5pm) °$30 per visit, cash only  °[facility] ° [address], [place] (336) 641-4533 Patients are seen by appointment only. Walk-ins are not accepted. [facility] will see patients 18 years of age and older. °One Wednesday Evening (Monthly: Volunteer Based).  $30 per visit, cash only  °[facility]  (919) 537-3737 for adults; Children under age 4, call Graduate Pediatric Dentistry at (919) [phone]. Children aged 4-14, please call (919) [phone] to request a pediatric application. ° Dental services are provided in all areas of dental care including fillings, crowns and bridges, complete and partial dentures, implants, gum treatment, root canals, and extractions. Preventive care is also provided. Treatment is provided to both adults and  children. °Patients are selected via a lottery and there is often a waiting list. °  °[facility] [address], ° ° (336) 763-8833 [email or web address] °  °[facility] [address], [place], [place] (336)723-1848, Ext. 123 Second and Fourth Thursday of each month, opens at 6:30 AM; Clinic ends at 9 AM.  Patients are seen on a first-come first-served basis, and a limited number are seen during each clinic.  ° °[facility] ° [address], [place], [place] (336) 723-7904   Eligibility Requirements °You must have lived in [place], [place], or [place] counties for at least the last three months. °  You cannot be eligible for state or federal sponsored healthcare insurance, including Veterans Administration, Medicaid, or Medicare. °  You generally cannot be eligible for healthcare insurance through your employer.  °  How to apply: °Eligibility screenings are held every Tuesday and Wednesday afternoon from 1:00 pm until 4:00 pm. You do not need an appointment for the interview!  °[facility] [address], [place], [place] 336-631-2330   °[facility] [facility]  336-342-8273   °[place]  336-703-3100   ° [facility]  336-570-6415   ° °Behavioral Health Resources in the Community: °Intensive Outpatient Programs °Organization         Address  Phone  Notes  °[facility] [address], [place], [place] 336-878-6098   °[facility] [address], [place], [place] 336-832-9800   °[facility] [address], [place], [place] ° 336-882-2125   °[facility] [address],  °[place], [place] 1-800-853-5163 or 336-641-4981   °Substance Abuse Resources °Organization         Address  Phone  Notes  °[facility]  336-882-2125   °[facility]  336-784-9470   °[facility]  336-285-9073    °[facility]  336-845-3988   °Residential & Outpatient Substance Abuse Program  1-800-659-3381   °Psychological Services °Organization         Address  Phone  Notes  °[facility]  336- 832-9600   °[facility]  336- 378-7881   °[facility] [address], [place] 1-800-853-5163 or 336-641-4981   ° °Mobile Crisis Teams °Organization         Address  Phone  Notes  °[facility], Mobile Crisis Care Unit  1-877-626-1772   °Assertive °[facility] ° [address] [place], [place] 336-834-9664   °Sharon DeEsch [address] °[place] 336-554-5454   ° °Self-Help/Support Groups °Organization         Address  Phone             Notes  °[facility] - variety of support groups  336- 373-1402 Call for more information  °Narcotics Anonymous (NA), [facility] [address], °[place]  2 meetings at this location  ° °Residential Treatment Programs °Organization         Address  Phone  Notes  °[facility] [address],    °[place]  1-866-801-8205   °[facility] ° [address], [place], [place] 704-293-8524   °[facility] [address], [place] 336-845-3988 Admissions: 8am-3pm M-F  °[facility] [address]    °[place], [place] 336-841-1104   °[facility] [address], [place], [place] 336-379-7146   °[facility] [address]  °[place], [place] 336-285-9073   °[facility] Intensive Outpatient [address], [place], [place] 336-852-3033   °[facility] [address]  °[place], [place] 1-877-615-2722 or 336-784-9470   °[facility] [address] [place], [place] 336-227-7417 Accepts Medicaid  °[facility] [address]  ° [place] 1-800-659-3381 Substance Abuse/Addiction Treatment  ° °[place]  Behavioral Health Resources °Organization           Address  Phone  Notes  °[facility]  (888) 581-9988   °Julie Brannon, PhD [address] [place], [place]   (336) 349-5553 or (336) 951-0000   °[facility]   [address] °[place], [place] (336) 349-4454   °[facility] [address], [place], [place] (336) 342-8316 Insurance/Medicaid/sponsorship through [facility]  °[facility] [address]                                    [place], [place] (336) 342-8316 Therapy/tele-psych/case  °[facility] [address]  ° [place], [place] (336) 349-2233    °Dr. Arfeen  (336) 349-4544   °[facility] [place]  [employer] [place] [facility] 1) [address], [place] °2) [address], [place] °3)  [address], [place] (336) 349-3220 °(336) 342-7768 ° °(336) 342-8140   °[place] [facility] (336) 342-1394 or (336) 342-3537 (After Hours)    ° ° ° °

## 2013-06-22 NOTE — ED Provider Notes (Signed)
CSN: [id number]     Arrival date & time 06/22/13  1149 History   First MD Initiated Contact with Patient 06/22/13 1207     Chief Complaint  Patient presents with  . Neck Pain     (Consider location/radiation/quality/duration/timing/severity/associated sxs/prior Treatment) Patient is a 53 y.o. female presenting with neck pain. The history is provided by the patient.  Neck Pain Pain location:  Generalized neck Quality:  Shooting and stabbing Pain radiates to:  Does not radiate Pain severity:  Moderate Pain is:  Same all the time Onset quality:  Gradual Duration:  2 weeks Timing:  Constant Progression:  Worsening Chronicity:  Chronic Context: not fall and not recent injury   Relieved by:  Nothing Worsened by:  Nothing tried Associated symptoms: tingling (bilateral arms)   Associated symptoms: no chest pain and no fever     Past Medical History  Diagnosis Date  . Fibromyalgia   . Hypertension   . Arthritis   . Chronic back pain    Past Surgical History  Procedure Laterality Date  . Neck surgery     History reviewed. No pertinent family history. History  Substance Use Topics  . Smoking status: Never Smoker   . Smokeless tobacco: Not on file  . Alcohol Use: Yes   OB History   Grav Para Term Preterm Abortions TAB SAB Ect Mult Living                 Review of Systems  Constitutional: Negative for fever.  Respiratory: Negative for cough and shortness of breath.   Cardiovascular: Negative for chest pain and leg swelling.  Gastrointestinal: Negative for nausea, vomiting and abdominal pain.  Musculoskeletal: Positive for neck pain.  Neurological: Positive for tingling (bilateral arms).  All other systems reviewed and are negative.     Allergies  Review of patient's allergies indicates no known allergies.  Home Medications   Prior to Admission medications   Medication Sig Start Date End Date Taking? Authorizing Provider  acetaminophen (TYLENOL) 500 MG tablet  Take 1,500 mg by mouth every 6 (six) hours as needed for moderate pain.   Yes Historical Provider, MD   BP 183/85  Pulse 61  Temp(Src) 98.1 F (36.7 C) (Oral)  Resp 20  SpO2 100% Physical Exam  Nursing note and vitals reviewed. Constitutional: She is oriented to person, place, and time. She appears well-developed and well-nourished. No distress.  HENT:  Head: Normocephalic and atraumatic.  Eyes: EOM are normal. Pupils are equal, round, and reactive to light.  Neck: Normal range of motion. Neck supple.  Cardiovascular: Normal rate and regular rhythm.  Exam reveals no friction rub.   No murmur heard. Pulmonary/Chest: Effort normal and breath sounds normal. No respiratory distress. She has no wheezes. She has no rales.  Abdominal: Soft. She exhibits no distension. There is no tenderness. There is no rebound.  Musculoskeletal: She exhibits no edema.       Cervical back: She exhibits decreased range of motion, tenderness and spasm (diffuse, L>R). She exhibits no swelling and no edema.  Neurological: She is alert and oriented to person, place, and time.  Skin: She is not diaphoretic.    ED Course  Procedures (including critical care time) Labs Review Labs Reviewed - No data to display  Imaging Review No results found.   EKG Interpretation None      MDM   Final diagnoses:  Neck pain    85F with chronic neck pain presents with 2 weeks of worsening  pain. Isn't on narcotics for her pain control. Hx of extensive neck surgery. No injuries or fever. Review of narcotic database shows only one prior Rx. Here vitals stable. Diffuse spasm throughout neck. Will give dilaudid and valium. States bilateral tingling in hands, normal strength and sensation, no need for imaging.  Feeling better, some nausea from the pain meds with vomiting x 1, zofran given. Patient stable for discharge.   Dagmar HaitWilliam Dervin Vore, MD 06/22/13 1430

## 2013-08-04 ENCOUNTER — Encounter (HOSPITAL_COMMUNITY): Payer: Self-pay | Admitting: Emergency Medicine

## 2013-08-04 ENCOUNTER — Emergency Department (HOSPITAL_COMMUNITY): Payer: Medicaid Other

## 2013-08-04 ENCOUNTER — Emergency Department (HOSPITAL_COMMUNITY)
Admission: EM | Admit: 2013-08-04 | Discharge: 2013-08-05 | Disposition: A | Discharge status: Home / Self Care | Payer: Medicaid Other | Attending: Emergency Medicine | Admitting: Emergency Medicine

## 2013-08-04 DIAGNOSIS — M25473 Effusion, unspecified ankle: Secondary | ICD-10-CM | POA: Insufficient documentation

## 2013-08-04 DIAGNOSIS — Z79899 Other long term (current) drug therapy: Secondary | ICD-10-CM | POA: Insufficient documentation

## 2013-08-04 DIAGNOSIS — G8929 Other chronic pain: Secondary | ICD-10-CM | POA: Insufficient documentation

## 2013-08-04 DIAGNOSIS — M25476 Effusion, unspecified foot: Secondary | ICD-10-CM | POA: Insufficient documentation

## 2013-08-04 DIAGNOSIS — I10 Essential (primary) hypertension: Secondary | ICD-10-CM | POA: Insufficient documentation

## 2013-08-04 DIAGNOSIS — M25579 Pain in unspecified ankle and joints of unspecified foot: Secondary | ICD-10-CM | POA: Insufficient documentation

## 2013-08-04 DIAGNOSIS — M79672 Pain in left foot: Secondary | ICD-10-CM

## 2013-08-04 DIAGNOSIS — M129 Arthropathy, unspecified: Secondary | ICD-10-CM | POA: Insufficient documentation

## 2013-08-04 DIAGNOSIS — R269 Unspecified abnormalities of gait and mobility: Secondary | ICD-10-CM | POA: Insufficient documentation

## 2013-08-04 MED ORDER — OXYCODONE-ACETAMINOPHEN 5-325 MG PO TABS
2.0000 | ORAL_TABLET | Freq: Once | ORAL | Status: AC
  Administered 2013-08-04: 2 via ORAL
  Filled 2013-08-04: qty 2

## 2013-08-04 MED ORDER — ONDANSETRON 4 MG PO TBDP
4.0000 mg | ORAL_TABLET | Freq: Once | ORAL | Status: AC
  Administered 2013-08-04: 4 mg via ORAL
  Filled 2013-08-04: qty 1

## 2013-08-04 NOTE — ED Notes (Signed)
Pt has a ride home.  

## 2013-08-04 NOTE — ED Provider Notes (Signed)
CSN: [id number]     Arrival date & time 08/04/13  2143 History  This chart was scribed for non-physician practitioner Marlon Pel working with No att. providers found by Carl Best, ED Scribe. This patient was seen in room WTR6/WTR6 and the patient's care was started at 10:16 PM.     Chief Complaint  Patient presents with  . Foot Pain   The history is provided by the patient. No language interpreter was used.   HPI Comments: [name] [name] is a 53 y.o. female who presents to the Emergency Department complaining of constant left heel pain that started a few months ago but has gradually worsened over the past couple of days.  The patient states that she walks she will experience sharp pain in her heel radiating throughout her toes and left leg.  She states that she has not experienced any pain in her right heel.  The patient states that she has taken Ibuprofen with no relief to her symptoms.  She denies having a history of DM.  Denies injury, rash, wound. The patient states that she has a family history of gout and bone spurs.  She denies having any allergies to medication.    Past Medical History  Diagnosis Date  . Fibromyalgia   . Hypertension   . Arthritis   . Chronic back pain    Past Surgical History  Procedure Laterality Date  . Neck surgery     No family history on file. History  Substance Use Topics  . Smoking status: Never Smoker   . Smokeless tobacco: Not on file  . Alcohol Use: Yes   OB History   Grav Para Term Preterm Abortions TAB SAB Ect Mult Living                 Review of Systems  Musculoskeletal: Positive for arthralgias, gait problem and joint swelling.  All other systems reviewed and are negative.     Allergies  Review of patient's allergies indicates no known allergies.  Home Medications   Prior to Admission medications   Medication Sig Start Date End Date Taking? Authorizing Provider  ibuprofen (ADVIL,MOTRIN) 200 MG tablet Take 600 mg by  mouth every 6 (six) hours as needed for mild pain.   Yes Historical Provider, MD  indomethacin (INDOCIN) 25 MG capsule Take 1 capsule (25 mg total) by mouth 3 (three) times daily as needed. 08/05/13   Jaclyn Andy Irine Seal, PA-C  oxyCODONE-acetaminophen (PERCOCET/ROXICET) 5-325 MG per tablet Take 1 tablet by mouth every 6 (six) hours as needed for severe pain. 08/05/13   Dorthula Matas, PA-C   Triage Vitals: BP 164/96  Pulse 82  Temp(Src) 99 F (37.2 C) (Oral)  Resp 20  SpO2 99%  Physical Exam  Nursing note and vitals reviewed. Constitutional: She is oriented to person, place, and time. She appears well-developed and well-nourished.  HENT:  Head: Normocephalic and atraumatic.  Eyes: EOM are normal.  Neck: Normal range of motion.  Cardiovascular: Normal rate.   Pulmonary/Chest: Effort normal.  Musculoskeletal: Normal range of motion.  Left heel is tender but is not indurated, ecchymotic, or firm.    Neurological: She is alert and oriented to person, place, and time.  Skin: Skin is warm and dry.  Left heel: No skin changes.  The skin is soft.    Psychiatric: She has a normal mood and affect. Her behavior is normal.    ED Course  Procedures (including critical care time)  DIAGNOSTIC STUDIES: Oxygen Saturation  is 99% on room air, normal by my interpretation.    COORDINATION OF CARE: 10:19 PM- Discussed a clinical suspicion of gout in the patient's left heel and obtaining an x-ray of the patient's left foot.  Discussed administering pain medication in the ED.  The patient agreed to the treatment plan.   Labs Review Labs Reviewed - No data to display  Imaging Review Dg Foot Complete Left  08/04/2013   CLINICAL DATA:  Left heel pain for several months.  EXAM: LEFT FOOT - COMPLETE 3+ VIEW  COMPARISON:  None.  FINDINGS: There is no evidence of fracture or dislocation. The joint spaces are preserved. There is no evidence of talar subluxation; the subtalar joint is unremarkable in  appearance. Small plantar and posterior calcaneal spurs are seen. An os peroneum is noted.  No significant soft tissue abnormalities are seen.  IMPRESSION: 1. No evidence of fracture or dislocation. 2. Os peroneum noted.   Electronically Signed   By: Roanna RaiderJeffery  Chang M.D.   On: 08/04/2013 23:42     EKG Interpretation None      MDM   Final diagnoses:  Heel pain, left    Patient has a PCP that she is supposed to see early this week. Her exam shows no clinical signs of infection and she has no systemic signs of infection. She in tender in the heel like a gout. Will treat her as such and have her follow-up with her PCP.  53 y.o.[name] [name]'s evaluation in the Emergency Department is complete. It has been determined that no acute conditions requiring further emergency intervention are present at this time. The patient/guardian have been advised of the diagnosis and plan. We have discussed signs and symptoms that warrant return to the ED, such as changes or worsening in symptoms.  Vital signs are stable at discharge. Filed Vitals:   08/05/13 0020  BP: 171/93  Pulse: 61  Temp:   Resp: 16    Patient/guardian has voiced understanding and agreed to follow-up with the PCP or specialist.   I personally performed the services described in this documentation, which was scribed in my presence. The recorded information has been reviewed and is accurate.    Dorthula Matasiffany G Zaquan Duffner, PA-C 08/05/13 [phone]

## 2013-08-04 NOTE — ED Notes (Signed)
Pt c/o L heel pain for months. Pt states pain is getting worse, especially with walking. Pt has taken Motrin for pain. Last dose at 1900. Pt arrives in wheelchair.

## 2013-08-05 MED ORDER — OXYCODONE-ACETAMINOPHEN 5-325 MG PO TABS: 1.0000 | ORAL_TABLET | Freq: Four times a day (QID) | ORAL | Status: DC | PRN

## 2013-08-05 MED ORDER — INDOMETHACIN 25 MG PO CAPS: 25.0000 mg | ORAL_CAPSULE | Freq: Three times a day (TID) | ORAL | Status: DC | PRN

## 2013-08-05 MED ORDER — INDOMETHACIN 50 MG PO CAPS
50.0000 mg | ORAL_CAPSULE | Freq: Once | ORAL | Status: AC
  Administered 2013-08-05: 50 mg via ORAL
  Filled 2013-08-05: qty 1

## 2013-08-05 NOTE — ED Provider Notes (Signed)
Medical screening examination/treatment/procedure(s) were performed by non-physician practitioner and as supervising physician I was immediately available for consultation/collaboration.   EKG Interpretation None        [name] MawKristen N Kairos Panetta, DO 08/05/13 [phone]

## 2013-08-05 NOTE — Discharge Instructions (Signed)

## 2013-09-14 ENCOUNTER — Encounter: Payer: Self-pay | Admitting: Gastroenterology

## 2013-12-20 ENCOUNTER — Emergency Department (HOSPITAL_COMMUNITY)
Admission: EM | Admit: 2013-12-20 | Discharge: 2013-12-20 | Disposition: A | Discharge status: Home / Self Care | Payer: Medicaid Other | Attending: Emergency Medicine | Admitting: Emergency Medicine

## 2013-12-20 ENCOUNTER — Encounter (HOSPITAL_COMMUNITY): Payer: Self-pay | Admitting: Family Medicine

## 2013-12-20 ENCOUNTER — Emergency Department (HOSPITAL_COMMUNITY): Payer: Medicaid Other

## 2013-12-20 DIAGNOSIS — Y998 Other external cause status: Secondary | ICD-10-CM | POA: Insufficient documentation

## 2013-12-20 DIAGNOSIS — W228XXA Striking against or struck by other objects, initial encounter: Secondary | ICD-10-CM | POA: Insufficient documentation

## 2013-12-20 DIAGNOSIS — M1990 Unspecified osteoarthritis, unspecified site: Secondary | ICD-10-CM | POA: Insufficient documentation

## 2013-12-20 DIAGNOSIS — M542 Cervicalgia: Secondary | ICD-10-CM | POA: Insufficient documentation

## 2013-12-20 DIAGNOSIS — Y9389 Activity, other specified: Secondary | ICD-10-CM | POA: Diagnosis not present

## 2013-12-20 DIAGNOSIS — M25522 Pain in left elbow: Secondary | ICD-10-CM

## 2013-12-20 DIAGNOSIS — S59902A Unspecified injury of left elbow, initial encounter: Secondary | ICD-10-CM | POA: Insufficient documentation

## 2013-12-20 DIAGNOSIS — G8929 Other chronic pain: Secondary | ICD-10-CM | POA: Diagnosis not present

## 2013-12-20 DIAGNOSIS — I10 Essential (primary) hypertension: Secondary | ICD-10-CM | POA: Insufficient documentation

## 2013-12-20 DIAGNOSIS — Y9289 Other specified places as the place of occurrence of the external cause: Secondary | ICD-10-CM | POA: Diagnosis not present

## 2013-12-20 MED ORDER — HYDROCODONE-ACETAMINOPHEN 5-325 MG PO TABS
1.0000 | ORAL_TABLET | Freq: Once | ORAL | Status: AC
  Administered 2013-12-20: 1 via ORAL
  Filled 2013-12-20: qty 1

## 2013-12-20 MED ORDER — PREDNISONE 10 MG PO TABS: ORAL_TABLET | ORAL | Status: DC

## 2013-12-20 MED ORDER — HYDROCODONE-ACETAMINOPHEN 5-325 MG PO TABS: 1.0000 | ORAL_TABLET | Freq: Four times a day (QID) | ORAL | Status: DC | PRN

## 2013-12-20 NOTE — Discharge Instructions (Signed)
Tennis Elbow Your caregiver has diagnosed you with a condition often referred to as "tennis elbow." This results from small tears or soreness (inflammation) at the start (origin) of the extensor muscles of the forearm. Although the condition is often called tennis or golfer's elbow, it is caused by any repetitive action performed by your elbow. HOME CARE INSTRUCTIONS  If the condition has been short lived, rest may be the only treatment required. Using your opposite hand or arm to perform the task may help. Even changing your grip may help rest the extremity. These may even prevent the condition from recurring.  Longer standing problems, however, will often be relieved faster by:  Using anti-inflammatory agents.  Applying ice packs for 30 minutes at the end of the working day, at bed time, or when activities are finished.  Your caregiver may also have you wear a splint or sling. This will allow the inflamed tendon to heal. At times, steroid injections aided with a local anesthetic will be required along with splinting for 1 to 2 weeks. Two to three steroid injections will often solve the problem. In some long standing cases, the inflamed tendon does not respond to conservative (non-surgical) therapy. Then surgery may be required to repair it. MAKE SURE YOU:   Understand these instructions.  Will watch your condition.  Will get help right away if you are not doing well or get worse. Document Released: 01/05/2005 Document Revised: 03/30/2011 Document Reviewed: 08/24/2007 [facility]ExitCare Patient Information 2015 [employer] [place]. This information is not intended to replace advice given to you by your health care provider. Make sure you discuss any questions you have with your health care provider.   Emergency Department Resource Guide 1) Find a Doctor and Pay Out of Pocket Although you won't have to find out who is covered by your insurance plan, it is a good idea to ask around and get  recommendations. You will then need to call the office and see if the doctor you have chosen will accept you as a new patient and what types of options they offer for patients who are self-pay. Some doctors offer discounts or will set up payment plans for their patients who do not have insurance, but you will need to ask so you aren't surprised when you get to your appointment.  2) Contact Your Local Health Department Not all health departments have doctors that can see patients for sick visits, but many do, so it is worth a call to see if yours does. If you don't know where your local health department is, you can check in your phone book. The CDC also has a tool to help you locate your state's health department, and many state websites also have listings of all of their local health departments.  3) Find a Walk-in Clinic If your illness is not likely to be very severe or complicated, you may want to try a walk in clinic. These are popping up all over the country in pharmacies, drugstores, and shopping centers. They're usually staffed by nurse practitioners or physician assistants that have been trained to treat common illnesses and complaints. They're usually fairly quick and inexpensive. However, if you have serious medical issues or chronic medical problems, these are probably not your best option.  No Primary Care Doctor: - Call Health Connect at  [phone] - they can help you locate a primary care doctor that  accepts your insurance, provides certain services, etc. - Physician Referral Service- [phone]  Chronic Pain Problems: Organization  Address  Phone   Notes  °[facility]  (336) 297-2271 Patients need to be referred by their primary care doctor.  ° °Medication Assistance: °Organization         Address  Phone   Notes  °[place] [facility] [address] °[place], [place] [address] (336) 641-8030 --Must be a resident of  [place] °-- Must have NO insurance coverage whatsoever (no Medicaid/ Medicare, etc.) °-- The pt. MUST have a primary care doctor that directs their care regularly and follows them in the community °  °[facility]  (866) 331-1348   °[employer] Way  (888) 892-1162   ° °Agencies that provide inexpensive medical care: °Organization         Address  Phone   Notes  °[facility]  (336) 832-8035   °[facility]    (336) 832-7272   °[facility] [address] °[place], [place] [address] (336) 832-4777   °[facility] [address], °[place] (336) 271-4999   °[facility]    (336) 373-0678   °[facility]    (336) 272-1050   °[facility] ° [address], [place] Phone:  (336) 832-4444, Fax:  (336) 832-4440 Hours of Operation:  9 am - 6 pm, M-F.  Also accepts Medicaid/Medicare and self-pay.  °[facility] ° [address], [place] Phone: (336) 832-3150, Fax: (336) [phone]. Hours of Operation:  8:30 am - 5:30 pm, M-F.  Also accepts Medicaid and self-pay.  °[facility] [place] [address], [place] Phone: (336) 878-6027   °[facility] [address], [place], [place] (336)723-1848, Ext. 123 Mondays & Thursdays: 7-9 AM.  First 15 patients are seen on a first come, first serve basis. °  ° °Medicaid-accepting [place] Providers: ° °Organization         Address  Phone   Notes  °[facility] [address], [place] (336) 641-2100 Also accepts self-pay patients.  °[facility] [address], [place] ° (336) 856-9996   °[facility] [address], [place] (336) 288-8857   °[facility] [address] [place] [address], [place] (336) 299-7000   °Veita Bland [address], [place]  ° (336) 373-1557 Only accepts [place] Access  Medicaid patients after they have their name applied to their card.  ° °Self-Pay (no insurance) in [place]: ° °Organization         Address  Phone   Notes  °Sickle Cell Patients, [facility] [address], [place] (336) 832-1970   °[facility] [address], [place] (336) 832-4400   °[facility] ° [address], [place] (336) 992-4800   °[facility]/Dr. Osei-Bonsu ° [address] [place] [address], [place] or [address], [place] (336) [phone] Phone number for both [place] and [place] locations is the same.  °[facility] [address], [place] (336) 299-0000   °[facility] [address] [place] [address], [place] or [address] (336) 852-7530 °(336) 878-2260   °[facility] [address], [place] (336) 350-1642, phone; (336) 294-5005, fax Sees patients 1st and 3rd Saturday of every month.  Must not qualify   for public or private insurance (i.e. Medicaid, Medicare, [place] Health Choice, Veterans' Benefits) • Household income should be no more than 200% of the poverty level •The clinic cannot treat you if you are pregnant or think you are pregnant • Sexually transmitted diseases are not treated at the clinic.  ° ° °Dental Care: °Organization         Address  Phone  Notes  °[place] [facility] [address], [place] (336) 641-6152 Accepts children up to age 21 who are enrolled in Medicaid or [place] Health Choice; pregnant women with a Medicaid card; and children who have applied for Medicaid or [place] Health Choice, but were declined, whose parents can pay a reduced fee at time of service.  °[facility] [place]  [address], [place] (336) 641-7733 Accepts children up to age 21 who are enrolled in Medicaid or [place] Health Choice; pregnant women with a Medicaid  card; and children who have applied for Medicaid or [place] Health Choice, but were declined, whose parents can pay a reduced fee at time of service.  °[facility] ° [address], [place] (336) 641-4533 Patients are seen by appointment only. Walk-ins are not accepted. [facility] will see patients 18 years of age and older. °Monday - Tuesday (8am-5pm) °Most Wednesdays (8:30-5pm) °$30 per visit, cash only  °[facility] ° [address], [place] (336) 641-4533 Patients are seen by appointment only. Walk-ins are not accepted. [facility] will see patients 18 years of age and older. °One Wednesday Evening (Monthly: Volunteer Based).  $30 per visit, cash only  °[facility]  (919) 537-3737 for adults; Children under age 4, call Graduate Pediatric Dentistry at ([phone]) [phone]. Children aged 4-14, please call (919) [phone] to request a pediatric application. ° Dental services are provided in all areas of dental care including fillings, crowns and bridges, complete and partial dentures, implants, gum treatment, root canals, and extractions. Preventive care is also provided. Treatment is provided to both adults and children. °Patients are selected via a lottery and there is often a waiting list. °  °[facility] [address], °[place] ° (336) 763-8833 [email or web address] °  °[facility] [address], [place], [place] (336)723-1848, Ext. 123 Second and Fourth Thursday of each month, opens at 6:30 AM; Clinic ends at 9 AM.  Patients are seen on a first-come first-served basis, and a limited number are seen during each clinic.  ° °[facility] ° [address], [place], [place] (336) 723-7904   Eligibility Requirements °You must have lived in [place], [place], or [place] counties for at least the last three months. °  You cannot be eligible for state or federal sponsored healthcare insurance,  including Veterans Administration, Medicaid, or Medicare. °  You generally cannot be eligible for healthcare insurance through your employer.  °  How to apply: °Eligibility screenings are held every Tuesday and Wednesday afternoon from 1:00 pm until 4:00 pm. You do not need an appointment for the interview!  °[facility] [address], [place], [place] 336-631-2330   °[place] [facility]  336-342-8273   °[facility]  336-703-3100   °[facility]  336-570-6415   ° °Behavioral Health Resources in the Community: °Intensive Outpatient Programs °Organization         Address  Phone    Notes  °[facility] [address], [place], [place] 336-878-6098   °[facility] [address], [place], [place] 336-832-9800   °[facility] [address], [place], [place] ° 336-882-2125   °[facility] [address],  °[place], [place] 1-800-853-5163 or 336-641-4981   °Substance Abuse Resources °Organization         Address  Phone  Notes  °[facility]  336-882-2125   °[facility]  336-784-9470   °[facility]  336-285-9073   °[facility]  336-845-3988   °Residential & Outpatient Substance Abuse Program  1-800-659-3381   °Psychological Services °Organization         Address  Phone  Notes  ° Health  336- 832-9600   °[facility]  336- 378-7881   °[facility] [address], [place] 1-800-853-5163 or 336-641-4981   ° °Mobile Crisis Teams °Organization         Address  Phone  Notes  °[facility], Mobile Crisis Care Unit  1-877-626-1772   °Assertive °[facility] ° [address] [place], [place] 336-834-9664   °Sharon DeEsch [address] °[place] 336-554-5454   ° °Self-Help/Support Groups °Organization         Address  Phone             Notes  °[facility] [place] - variety of support groups  336- 373-1402 Call for more information  °Narcotics Anonymous (NA), [facility] [address], °[place]  2 meetings at this location  ° °Residential Treatment Programs °Organization         Address  Phone  Notes  °[facility] [address],    °[place]  1-866-801-8205   °[facility] ° [address], [place], [place] 704-293-8524   °[facility] [address], [place] 336-845-3988 Admissions: 8am-3pm M-F  °[facility] [address]    °[place], [place] 336-841-1104   °[facility] [address], [place], [place] 336-379-7146   °[facility] [address]  °[place], [place] 336-285-9073   °[facility] - Intensive Outpatient [address], [place], [place] 336-852-3033   °[facility] [address]  °[place], [place] 1-877-615-2722 or 336-784-9470   °[facility] [address] [place], [place] 336-227-7417 Accepts Medicaid  °[facility] [address]  °[place] 1-800-659-3381 Substance Abuse/Addiction Treatment  ° °[place] Behavioral Health Resources °Organization         Address  Phone  Notes  °[facility]  (888) 581-9988   °Julie Brannon, PhD [address] [place], [place]   (336) 349-5553 or (336) 951-0000   °[facility]   [address] °[place], [place] (336) 349-4454   °[facility] [address], [place], [place] (336) 342-8316 Insurance/Medicaid/sponsorship through [facility]  °[facility] [address]                                    [place], [place] (336) 342-8316 Therapy/tele-psych/case  °[facility] [address]  ° [place], [place] (336) 349-2233    °Dr. Arfeen  (336) 349-4544   °[facility] [place]  [employer] [place]   [facility] 1) [address], [place] 2)  [address], [place] 3)  [address], [place] 613-692-8232(336) [phone](336) 757-526-3385  ([phone](336) (651)[phone]   [facility] Child Abuse Hot[facility] 340-058-2205(336) 972-203-6541 or [phone](336) [phone] (After Hours)

## 2013-12-20 NOTE — ED Provider Notes (Signed)
CSN: [id number]     Arrival date & time 12/20/13  1433 History   This chart was scribed for non-physician practitioner, Eben Burowourtney A Forcucci, PA-C, working with Rolland PorterMark James, MD by Charline BillsEssence Howell, ED Scribe. This patient was seen in room TR07C/TR07C and the patient's care was started at 3:45 PM.   Chief Complaint  Patient presents with  . Arm Pain   The history is provided by the patient. No language interpreter was used.   HPI Comments: [name] [name] is a 53 y.o. female, with a h/o fibromyalgia, HTN, arthritis, who presents to the Emergency Department complaining of constant L elbow pain onset 3 weeks ago. Pt reports that she was carrying a large load of clothes into the laundry room when she hit her elbow on the door frame. She describes the pain as random sharp, electrical shock sensation that radiates into fingertips. Pt first noted radiating pain 3 days ago. Pain is exacerbated with lifting and movement. She reports associated redness and a tingling sensation in L fingertips and L wrist. Pt denies numbness, weakness, fever, chills, nausea, vomiting, joint swelling. Pt has been treating with 4 extra strength Tylenol tablets and ice without relief. No previous injury to L elbow. Pt is R hand dominant. No PCP.   Past Medical History  Diagnosis Date  . Fibromyalgia   . Hypertension   . Arthritis   . Chronic back pain    Past Surgical History  Procedure Laterality Date  . Neck surgery     History reviewed. No pertinent family history. History  Substance Use Topics  . Smoking status: Never Smoker   . Smokeless tobacco: Not on file  . Alcohol Use: Yes   OB History    No data available     Review of Systems  Constitutional: Negative for fever and chills.  Gastrointestinal: Negative for nausea and vomiting.  Musculoskeletal: Positive for myalgias, arthralgias and neck pain (crhonic). Negative for joint swelling.  Skin: Positive for color change.  Neurological: Negative for weakness  and numbness.  All other systems reviewed and are negative.  Allergies  Review of patient's allergies indicates no known allergies.  Home Medications   Prior to Admission medications   Medication Sig Start Date End Date Taking? Authorizing Provider  HYDROcodone-acetaminophen (NORCO/VICODIN) 5-325 MG per tablet Take 1 tablet by mouth every 6 (six) hours as needed for moderate pain or severe pain. 12/20/13   Charly Holcomb A Forcucci, PA-C  ibuprofen (ADVIL,MOTRIN) 200 MG tablet Take 600 mg by mouth every 6 (six) hours as needed for mild pain.    Historical Provider, MD  indomethacin (INDOCIN) 25 MG capsule Take 1 capsule (25 mg total) by mouth 3 (three) times daily as needed. 08/05/13   Tiffany Irine SealG Greene, PA-C  oxyCODONE-acetaminophen (PERCOCET/ROXICET) 5-325 MG per tablet Take 1 tablet by mouth every 6 (six) hours as needed for severe pain. 08/05/13   Tiffany Irine SealG Greene, PA-C  predniSONE (DELTASONE) 10 MG tablet Day 1 take 6 pills Day 2 take 5 pills Day 3 take 4 pills Day 4 take 3 pills Day 5 take 2 pills Day 6 take 1 pill 12/20/13   Zissy Hamlett A Forcucci, PA-C   Triage Vitals: BP 192/107 mmHg  Pulse 70  Temp(Src) 98.2 F (36.8 C) (Oral)  Resp 18  SpO2 100% Physical Exam  Constitutional: She is oriented to person, place, and time. She appears well-developed and well-nourished. No distress.  HENT:  Head: Normocephalic and atraumatic.  Nose: Nose normal.  Mouth/Throat: Oropharynx is  clear and moist.  Eyes: Conjunctivae and EOM are normal. Pupils are equal, round, and reactive to light.  Neck: Neck supple.  Cardiovascular: Normal rate and regular rhythm.  Exam reveals no gallop and no friction rub.   No murmur heard. Pulmonary/Chest: Effort normal and breath sounds normal.  Clear to ausculation.   Musculoskeletal:       Left elbow: She exhibits decreased range of motion. She exhibits no swelling, no effusion, no deformity and no laceration. Tenderness found. Medial epicondyle and lateral  epicondyle tenderness noted. No radial head and no olecranon process tenderness noted.       Left wrist: Normal.       Left hand: Normal. Normal sensation noted. Normal strength noted.  Equivocal Tinel sign over the cubital tunnel. Full range of motion of the elbow. There is pain with pronation supination of the hand.  Neurological: She is alert and oriented to person, place, and time.  Skin: Skin is warm and dry.  Psychiatric: She has a normal mood and affect. Her behavior is normal.  Nursing note and vitals reviewed.  ED Course  Procedures (including critical care time) DIAGNOSTIC STUDIES: Oxygen Saturation is 100% on RA, normal by my interpretation.    COORDINATION OF CARE: 3:58 PM-Discussed treatment plan which includes Prednisone, medication for pain and follow-up with orthopedist if needed with pt at bedside and pt agreed to plan.   Labs Review Labs Reviewed - No data to display  Imaging Review Dg Elbow Complete Left  12/20/2013   CLINICAL DATA:  Pt hit her left elbow on a door frame x 3 weeks ago. Pt is still having pain in her elbow that radiates into her fingers.  EXAM: LEFT ELBOW - COMPLETE 3+ VIEW  COMPARISON:  None.  FINDINGS: Degenerative irregularity about the ulnar are side of the joint. No acute fracture or dislocation. No joint effusion.  IMPRESSION: No acute osseous abnormality.   Electronically Signed   By: Jeronimo GreavesKyle  Talbot M.D.   On: 12/20/2013 15:32    EKG Interpretation None      MDM   Final diagnoses:  Left elbow pain   Patient is a 53 year old female who presents to the emergency room for evaluation of left elbow pain. Physical exam reveals neurovascularly intact left elbow and wrist. There is tenderness to palpation over the medial and lateral aspects of the elbow. There is an clinical cubital tunnel Tinel sign. Plain film x-ray of the elbow is negative for acute osseous abnormalities. Suspect that this is likely tennis elbow versus possible cubital tunnel  syndrome. I will discharge the patient home with a prednisone Dosepak to try to calm down inflammation. We have discussed over-the-counter tennis elbow straps. I will also give a short course of hydrocodone. Patient is to return for signs of septic joint. Patient states understanding and agreement at this time. Vital signs are stable. Patient is stable for discharge.   I personally performed the services described in this documentation, which was scribed in my presence. The recorded information has been reviewed and is accurate.     Eben Burowourtney A Forcucci, PA-C 12/20/13 1605  Rolland PorterMark James, MD 12/21/13 [phone]

## 2013-12-20 NOTE — ED Notes (Signed)
Per pt sts she hit her left elbow a few weeks ago and has been having sharp pains since. sts elbow radiating into hand.

## 2014-03-22 ENCOUNTER — Encounter: Payer: Self-pay | Admitting: Gastroenterology

## 2014-04-15 ENCOUNTER — Emergency Department (HOSPITAL_COMMUNITY): Payer: Medicaid Other

## 2014-04-15 ENCOUNTER — Emergency Department (HOSPITAL_COMMUNITY)
Admission: EM | Admit: 2014-04-15 | Discharge: 2014-04-15 | Disposition: A | Discharge status: Home / Self Care | Payer: Medicaid Other | Attending: Emergency Medicine | Admitting: Emergency Medicine

## 2014-04-15 ENCOUNTER — Encounter (HOSPITAL_COMMUNITY): Payer: Self-pay | Admitting: Family Medicine

## 2014-04-15 DIAGNOSIS — G8929 Other chronic pain: Secondary | ICD-10-CM | POA: Insufficient documentation

## 2014-04-15 DIAGNOSIS — B349 Viral infection, unspecified: Secondary | ICD-10-CM | POA: Insufficient documentation

## 2014-04-15 DIAGNOSIS — I10 Essential (primary) hypertension: Secondary | ICD-10-CM | POA: Insufficient documentation

## 2014-04-15 DIAGNOSIS — M797 Fibromyalgia: Secondary | ICD-10-CM | POA: Insufficient documentation

## 2014-04-15 DIAGNOSIS — Z79899 Other long term (current) drug therapy: Secondary | ICD-10-CM | POA: Insufficient documentation

## 2014-04-15 DIAGNOSIS — M1990 Unspecified osteoarthritis, unspecified site: Secondary | ICD-10-CM | POA: Insufficient documentation

## 2014-04-15 LAB — URINALYSIS, ROUTINE W REFLEX MICROSCOPIC
Bilirubin Urine: NEGATIVE
Glucose, UA: NEGATIVE mg/dL
Hgb urine dipstick: NEGATIVE
KETONES UR: NEGATIVE mg/dL
Leukocytes, UA: NEGATIVE
Nitrite: NEGATIVE
PROTEIN: NEGATIVE mg/dL
SPECIFIC GRAVITY, URINE: 1.014 (ref 1.005–1.030)
UROBILINOGEN UA: 0.2 mg/dL (ref 0.0–1.0)
pH: 6 (ref 5.0–8.0)

## 2014-04-15 LAB — BASIC METABOLIC PANEL
ANION GAP: 11 (ref 5–15)
BUN: 8 mg/dL (ref 6–23)
CO2: 24 mmol/L (ref 19–32)
CREATININE: 0.84 mg/dL (ref 0.50–1.10)
Calcium: 9 mg/dL (ref 8.4–10.5)
Chloride: 105 mmol/L (ref 96–112)
GFR calc non Af Amer: 79 mL/min — ABNORMAL LOW (ref >90)
Glucose, Bld: 97 mg/dL (ref 70–99)
POTASSIUM: 3.2 mmol/L — AB (ref 3.5–5.1)
SODIUM: 140 mmol/L (ref 135–145)

## 2014-04-15 LAB — RAPID STREP SCREEN (MED CTR MEBANE ONLY): Streptococcus, Group A Screen (Direct): NEGATIVE

## 2014-04-15 LAB — CBC WITH DIFFERENTIAL/PLATELET
BASOS PCT: 0 % (ref 0–1)
Basophils Absolute: 0 10*3/uL (ref 0.0–0.1)
EOS PCT: 2 % (ref 0–5)
Eosinophils Absolute: 0.1 10*3/uL (ref 0.0–0.7)
HEMATOCRIT: 38.2 % (ref 36.0–46.0)
Hemoglobin: 12.9 g/dL (ref 12.0–15.0)
LYMPHS ABS: 2.3 10*3/uL (ref 0.7–4.0)
Lymphocytes Relative: 26 % (ref 12–46)
MCH: 30.8 pg (ref 26.0–34.0)
MCHC: 33.8 g/dL (ref 30.0–36.0)
MCV: 91.2 fL (ref 78.0–100.0)
MONOS PCT: 8 % (ref 3–12)
Monocytes Absolute: 0.7 10*3/uL (ref 0.1–1.0)
NEUTROS ABS: 5.8 10*3/uL (ref 1.7–7.7)
Neutrophils Relative %: 64 % (ref 43–77)
Platelets: 199 10*3/uL (ref 150–400)
RBC: 4.19 MIL/uL (ref 3.87–5.11)
RDW: 12.5 % (ref 11.5–15.5)
WBC: 8.9 10*3/uL (ref 4.0–10.5)

## 2014-04-15 LAB — TROPONIN I

## 2014-04-15 MED ORDER — DOXYCYCLINE HYCLATE 100 MG PO CAPS: 100.0000 mg | ORAL_CAPSULE | Freq: Two times a day (BID) | ORAL | Status: DC

## 2014-04-15 MED ORDER — KETOROLAC TROMETHAMINE 60 MG/2ML IM SOLN
60.0000 mg | Freq: Once | INTRAMUSCULAR | Status: AC
  Administered 2014-04-15: 60 mg via INTRAMUSCULAR
  Filled 2014-04-15: qty 2

## 2014-04-15 MED ORDER — HYDROCHLOROTHIAZIDE 25 MG PO TABS: 25.0000 mg | ORAL_TABLET | Freq: Every day | ORAL | Status: DC

## 2014-04-15 MED ORDER — ACETAMINOPHEN 325 MG PO TABS
650.0000 mg | ORAL_TABLET | Freq: Once | ORAL | Status: AC
  Administered 2014-04-15: 650 mg via ORAL
  Filled 2014-04-15: qty 2

## 2014-04-15 NOTE — ED Notes (Signed)
Pt sts headache, body aches, low grade fever, cough. sts recently bit by a tick.

## 2014-04-15 NOTE — ED Notes (Signed)
Patient transported to X-ray 

## 2014-04-15 NOTE — Discharge Instructions (Signed)
Viral Infections Take your blood pressure medications as prescribed. Follow up with your doctor. Return to the ED if you develop new or worsening symptoms. A viral infection can be caused by different types of viruses.Most viral infections are not serious and resolve on their own. However, some infections may cause severe symptoms and may lead to further complications. SYMPTOMS Viruses can frequently cause:  Minor sore throat.  Aches and pains.  Headaches.  Runny nose.  Different types of rashes.  Watery eyes.  Tiredness.  Cough.  Loss of appetite.  Gastrointestinal infections, resulting in nausea, vomiting, and diarrhea. These symptoms do not respond to antibiotics because the infection is not caused by bacteria. However, you might catch a bacterial infection following the viral infection. This is sometimes called a "superinfection." Symptoms of such a bacterial infection may include:  Worsening sore throat with pus and difficulty swallowing.  Swollen neck glands.  Chills and a high or persistent fever.  Severe headache.  Tenderness over the sinuses.  Persistent overall ill feeling (malaise), muscle aches, and tiredness (fatigue).  Persistent cough.  Yellow, green, or brown mucus production with coughing. HOME CARE INSTRUCTIONS   Only take over-the-counter or prescription medicines for pain, discomfort, diarrhea, or fever as directed by your caregiver.  Drink enough water and fluids to keep your urine clear or pale yellow. Sports drinks can provide valuable electrolytes, sugars, and hydration.  Get plenty of rest and maintain proper nutrition. Soups and broths with crackers or rice are fine. SEEK IMMEDIATE MEDICAL CARE IF:   You have severe headaches, shortness of breath, chest pain, neck pain, or an unusual rash.  You have uncontrolled vomiting, diarrhea, or you are unable to keep down fluids.  You or your child has an oral temperature above 102 F (38.9 C),  not controlled by medicine.  Your baby is older than 3 months with a rectal temperature of 102 F (38.9 C) or higher.  Your baby is 193 months old or younger with a rectal temperature of 100.4 F (38 C) or higher. MAKE SURE YOU:   Understand these instructions.  Will watch your condition.  Will get help right away if you are not doing well or get worse. Document Released: 10/15/2004 Document Revised: 03/30/2011 Document Reviewed: 05/12/2010 [facility] Patient Information 2015 [place], [place]. This information is not intended to replace advice given to you by your health care provider. Make sure you discuss any questions you have with your health care provider.

## 2014-04-15 NOTE — ED Provider Notes (Signed)
CSN: [id number]     Arrival date & time 04/15/14  1320 History   First MD Initiated Contact with Patient 04/15/14 1400     Chief Complaint  Patient presents with  . Generalized Body Aches  . Headache     (Consider location/radiation/quality/duration/timing/severity/associated sxs/prior Treatment) HPI Comments: Patient with 3 day history of body aches, headache, fever, cough, runny nose and sore throat. Symptoms worsening today. Other blood pressure medication for several weeks. History of hypertension, fibromyalgia and rheumatoid arthritis. MAXIMUM TEMPERATURE at home 100. Endorses sick contacts. She did receive flu shot. No focal weakness, numbness or tingling. Constant chest pain since this morning that is worse with coughing and palpation. No abdominal pain, nausea or vomiting. Bitten by tick last week.  The history is provided by the patient.    Past Medical History  Diagnosis Date  . Fibromyalgia   . Hypertension   . Arthritis   . Chronic back pain    Past Surgical History  Procedure Laterality Date  . Neck surgery     History reviewed. No pertinent family history. History  Substance Use Topics  . Smoking status: Never Smoker   . Smokeless tobacco: Not on file  . Alcohol Use: Yes   OB History    No data available     Review of Systems  Constitutional: Positive for activity change, appetite change and fatigue. Negative for fever.  HENT: Negative for congestion and rhinorrhea.   Respiratory: Negative for cough, chest tightness and shortness of breath.   Cardiovascular: Negative for chest pain.  Gastrointestinal: Negative for nausea, vomiting and abdominal pain.  Genitourinary: Negative for dysuria, vaginal bleeding and vaginal discharge.  Musculoskeletal: Positive for myalgias and arthralgias. Negative for neck pain and neck stiffness.  Neurological: Positive for headaches. Negative for weakness.  A complete 10 system review of systems was obtained and all systems  are negative except as noted in the HPI and PMH.      Allergies  Review of patient's allergies indicates no known allergies.  Home Medications   Prior to Admission medications   Medication Sig Start Date End Date Taking? Authorizing Provider  acetaminophen (TYLENOL) 500 MG tablet Take 1,000 mg by mouth every 6 (six) hours as needed (pain).   Yes Historical Provider, MD  atorvastatin (LIPITOR) 20 MG tablet Take 20 mg by mouth daily. 02/13/14  Yes Historical Provider, MD  citalopram (CELEXA) 20 MG tablet Take 20 mg by mouth daily. 02/13/14  Yes Historical Provider, MD  ibuprofen (ADVIL,MOTRIN) 200 MG tablet Take 600 mg by mouth every 6 (six) hours as needed for mild pain.   Yes Historical Provider, MD  omeprazole (PRILOSEC) 20 MG capsule Take 20 mg by mouth daily. 02/13/14  Yes Historical Provider, MD  Phenyleph-Diphenhyd-DM-APAP (THERAFLU SEVERE COLD & COUGH PO) Take 30 mLs by mouth every 4 (four) hours as needed (cold and flu symptoms).   Yes Historical Provider, MD  hydrochlorothiazide (HYDRODIURIL) 25 MG tablet Take 1 tablet (25 mg total) by mouth daily. 04/15/14   Glynn Octave, MD  HYDROcodone-acetaminophen (NORCO/VICODIN) 5-325 MG per tablet Take 1 tablet by mouth every 6 (six) hours as needed for moderate pain or severe pain. Patient not taking: Reported on 04/15/2014 12/20/13   Terri Piedra, PA-C  indomethacin (INDOCIN) 25 MG capsule Take 1 capsule (25 mg total) by mouth 3 (three) times daily as needed. Patient not taking: Reported on 04/15/2014 08/05/13   Marlon Pel, PA-C  oxyCODONE-acetaminophen (PERCOCET/ROXICET) 5-325 MG per tablet Take 1 tablet by mouth  every 6 (six) hours as needed for severe pain. Patient not taking: Reported on 04/15/2014 08/05/13   Marlon Peliffany Greene, PA-C  predniSONE (DELTASONE) 10 MG tablet Day 1 take 6 pills Day 2 take 5 pills Day 3 take 4 pills Day 4 take 3 pills Day 5 take 2 pills Day 6 take 1 pill Patient not taking: Reported on 04/15/2014 12/20/13    Toni Amendourtney Forcucci, PA-C   BP 163/88 mmHg  Pulse 76  Temp(Src) 99.5 F (37.5 C) (Oral)  Resp 20  SpO2 99% Physical Exam  Constitutional: She is oriented to person, place, and time. She appears well-developed and well-nourished. No distress.  HENT:  Head: Normocephalic and atraumatic.  Mouth/Throat: Oropharynx is clear and moist. No oropharyngeal exudate.  Eyes: Conjunctivae and EOM are normal. Pupils are equal, round, and reactive to light.  Neck: Normal range of motion. Neck supple.  No meningismus.  Cardiovascular: Normal rate, regular rhythm, normal heart sounds and intact distal pulses.   No murmur heard. Pulmonary/Chest: Effort normal and breath sounds normal. No respiratory distress. She exhibits tenderness.  Abdominal: Soft. There is no tenderness. There is no rebound and no guarding.  Musculoskeletal: Normal range of motion. She exhibits no edema or tenderness.  Neurological: She is alert and oriented to person, place, and time. No cranial nerve deficit. She exhibits normal muscle tone. Coordination normal.  No ataxia on finger to nose bilaterally. No pronator drift. 5/5 strength throughout. CN 2-12 intact. Negative Romberg. Equal grip strength. Sensation intact. Gait is normal.   Skin: Skin is warm.  Psychiatric: She has a normal mood and affect. Her behavior is normal.  Nursing note and vitals reviewed.   ED Course  Procedures (including critical care time) Labs Review Labs Reviewed  BASIC METABOLIC PANEL - Abnormal; Notable for the following:    Potassium 3.2 (*)    GFR calc non Af Amer 79 (*)    All other components within normal limits  RAPID STREP SCREEN  CULTURE, GROUP A STREP  CBC WITH DIFFERENTIAL/PLATELET  URINALYSIS, ROUTINE W REFLEX MICROSCOPIC  TROPONIN I    Imaging Review Dg Chest 2 View  04/15/2014   CLINICAL DATA:  Generalized body ache, headache  EXAM: CHEST  2 VIEW  COMPARISON:  01/07/2013  FINDINGS: Cardiomediastinal silhouette is stable. No  acute infiltrate or pleural effusion. No pulmonary edema. Bony thorax is unremarkable.  IMPRESSION: No active cardiopulmonary disease.   Electronically Signed   By: Natasha MeadLiviu  Pop M.D.   On: 04/15/2014 15:06   Ct Head Wo Contrast  04/15/2014   CLINICAL DATA:  Headache, weakness for 3 days  EXAM: CT HEAD WITHOUT CONTRAST  TECHNIQUE: Contiguous axial images were obtained from the base of the skull through the vertex without intravenous contrast.  COMPARISON:  03/23/2012  FINDINGS: No skull fracture is noted. There is mucosal thickening with almost complete opacification left maxillary sinus. The mastoid air cells are unremarkable.  No intracranial hemorrhage, mass effect or midline shift. No acute cortical infarction. No mass lesion is noted on this unenhanced scan. Stable nonspecific patchy white matter decreased attenuation.  IMPRESSION: No acute intracranial abnormality. Stable nonspecific patchy small areas of white matter decreased attenuation.   Electronically Signed   By: Natasha MeadLiviu  Pop M.D.   On: 04/15/2014 15:47     EKG Interpretation   Date/Time:  Sunday April 15 2014 14:32:10 EDT Ventricular Rate:  68 PR Interval:  151 QRS Duration: 87 QT Interval:  425 QTC Calculation: 452 R Axis:   47 Text  Interpretation:  Sinus rhythm No significant change was found  Confirmed by Manus Gunning  MD, Daymien Goth [phone]) on 04/15/2014 3:24:53 PM      MDM   Final diagnoses:  Viral syndrome  Essential hypertension   3 day history of flulike symptoms including headache, body aches, fever and cough. Recent tick bite. Chest pain is constant since this morning and reproducible. EKG is normal sinus rhythm.  CXR negative. Chest pain is reproducible and atypical for ACS. Labs reassuring.  PO and IV hydration given.  Suspect viral syndrome, possibly influenza.  Supportive care d/w patient.  Restart BP meds.  CT head neg. Fluids and antipyretics at home. Follow up with PCP. Return precautions discussed. Empiric  doxycycline for tick exposure.   Glynn Octave, MD 04/15/14 [phone]

## 2014-04-15 NOTE — ED Notes (Signed)
Patient returned from X-ray 

## 2014-04-18 LAB — CULTURE, GROUP A STREP: STREP A CULTURE: NEGATIVE

## 2014-08-17 ENCOUNTER — Emergency Department (HOSPITAL_COMMUNITY): Payer: Medicaid Other

## 2014-08-17 ENCOUNTER — Emergency Department (HOSPITAL_COMMUNITY)
Admission: EM | Admit: 2014-08-17 | Discharge: 2014-08-17 | Disposition: A | Discharge status: Home / Self Care | Payer: Self-pay | Attending: Emergency Medicine | Admitting: Emergency Medicine

## 2014-08-17 ENCOUNTER — Encounter (HOSPITAL_COMMUNITY): Payer: Self-pay | Admitting: *Deleted

## 2014-08-17 DIAGNOSIS — R300 Dysuria: Secondary | ICD-10-CM | POA: Insufficient documentation

## 2014-08-17 DIAGNOSIS — Z91148 Patient's other noncompliance with medication regimen for other reason: Secondary | ICD-10-CM

## 2014-08-17 DIAGNOSIS — I10 Essential (primary) hypertension: Secondary | ICD-10-CM

## 2014-08-17 DIAGNOSIS — M1990 Unspecified osteoarthritis, unspecified site: Secondary | ICD-10-CM | POA: Insufficient documentation

## 2014-08-17 DIAGNOSIS — G8929 Other chronic pain: Secondary | ICD-10-CM | POA: Insufficient documentation

## 2014-08-17 DIAGNOSIS — Z9114 Patient's other noncompliance with medication regimen: Secondary | ICD-10-CM | POA: Insufficient documentation

## 2014-08-17 DIAGNOSIS — Z79899 Other long term (current) drug therapy: Secondary | ICD-10-CM | POA: Insufficient documentation

## 2014-08-17 DIAGNOSIS — M5440 Lumbago with sciatica, unspecified side: Secondary | ICD-10-CM

## 2014-08-17 LAB — URINALYSIS, ROUTINE W REFLEX MICROSCOPIC
BILIRUBIN URINE: NEGATIVE
Glucose, UA: NEGATIVE mg/dL
HGB URINE DIPSTICK: NEGATIVE
KETONES UR: NEGATIVE mg/dL
Leukocytes, UA: NEGATIVE
Nitrite: NEGATIVE
Protein, ur: NEGATIVE mg/dL
SPECIFIC GRAVITY, URINE: 1.022 (ref 1.005–1.030)
Urobilinogen, UA: 0.2 mg/dL (ref 0.0–1.0)
pH: 5.5 (ref 5.0–8.0)

## 2014-08-17 MED ORDER — HYDROCHLOROTHIAZIDE 25 MG PO TABS
25.0000 mg | ORAL_TABLET | Freq: Every day | ORAL | Status: DC
  Administered 2014-08-17: 25 mg via ORAL
  Filled 2014-08-17: qty 1

## 2014-08-17 MED ORDER — NAPROXEN 500 MG PO TABS: 500.0000 mg | ORAL_TABLET | Freq: Two times a day (BID) | ORAL | Status: DC

## 2014-08-17 MED ORDER — HYDROCODONE-ACETAMINOPHEN 5-325 MG PO TABS: 1.0000 | ORAL_TABLET | Freq: Four times a day (QID) | ORAL | Status: DC | PRN

## 2014-08-17 MED ORDER — METHOCARBAMOL 500 MG PO TABS
500.0000 mg | ORAL_TABLET | Freq: Once | ORAL | Status: AC
  Administered 2014-08-17: 500 mg via ORAL
  Filled 2014-08-17: qty 1

## 2014-08-17 MED ORDER — METHOCARBAMOL 500 MG PO TABS: 500.0000 mg | ORAL_TABLET | Freq: Two times a day (BID) | ORAL | Status: DC

## 2014-08-17 MED ORDER — HYDROCHLOROTHIAZIDE 25 MG PO TABS: 25.0000 mg | ORAL_TABLET | Freq: Every day | ORAL | Status: AC

## 2014-08-17 MED ORDER — OXYCODONE-ACETAMINOPHEN 5-325 MG PO TABS
2.0000 | ORAL_TABLET | Freq: Once | ORAL | Status: AC
  Administered 2014-08-17: 2 via ORAL
  Filled 2014-08-17: qty 2

## 2014-08-17 NOTE — ED Notes (Signed)
The pt is c/o lower back pain for 3 weeks since she moved.  She has been upstairs with her husband who is a pt.  Pain worse for the past 2 days

## 2014-08-17 NOTE — Discharge Instructions (Signed)
1. Medications: robaxin, naproxyn, vicodin, HCTZ usual home medications 2. Treatment: rest, drink plenty of fluids, gentle stretching as discussed, alternate ice and heat 3. Follow Up: Please followup with your primary doctor in 3 days for discussion of your diagnoses and further evaluation after today's visit; if you do not have a primary care doctor use the resource guide provided to find one;  Return to the ER for worsening back pain, difficulty walking, loss of bowel or bladder control or other concerning symptoms    Back Exercises Back exercises help treat and prevent back injuries. The goal of back exercises is to increase the strength of your abdominal and back muscles and the flexibility of your back. These exercises should be started when you no longer have back pain. Back exercises include:  Pelvic Tilt. Lie on your back with your knees bent. Tilt your pelvis until the lower part of your back is against the floor. Hold this position 5 to 10 sec and repeat 5 to 10 times.  Knee to Chest. Pull first 1 knee up against your chest and hold for 20 to 30 seconds, repeat this with the other knee, and then both knees. This may be done with the other leg straight or bent, whichever feels better.  Sit-Ups or Curl-Ups. Bend your knees 90 degrees. Start with tilting your pelvis, and do a partial, slow sit-up, lifting your trunk only 30 to 45 degrees off the floor. Take at least 2 to 3 seconds for each sit-up. Do not do sit-ups with your knees out straight. If partial sit-ups are difficult, simply do the above but with only tightening your abdominal muscles and holding it as directed.  Hip-Lift. Lie on your back with your knees flexed 90 degrees. Push down with your feet and shoulders as you raise your hips a couple inches off the floor; hold for 10 seconds, repeat 5 to 10 times.  Back arches. Lie on your stomach, propping yourself up on bent elbows. Slowly press on your hands, causing an arch in your  low back. Repeat 3 to 5 times. Any initial stiffness and discomfort should lessen with repetition over time.  Shoulder-Lifts. Lie face down with arms beside your body. Keep hips and torso pressed to floor as you slowly lift your head and shoulders off the floor. Do not overdo your exercises, especially in the beginning. Exercises may cause you some mild back discomfort which lasts for a few minutes; however, if the pain is more severe, or lasts for more than 15 minutes, do not continue exercises until you see your caregiver. Improvement with exercise therapy for back problems is slow.  See your caregivers for assistance with developing a proper back exercise program. Document Released: 02/13/2004 Document Revised: 03/30/2011 Document Reviewed: 11/06/2010 [facility] Patient Information 2015 [place], [place]. This information is not intended to replace advice given to you by your health care provider. Make sure you discuss any questions you have with your health care provider.    Emergency Department Resource Guide 1) Find a Doctor and Pay Out of Pocket Although you won't have to find out who is covered by your insurance plan, it is a good idea to ask around and get recommendations. You will then need to call the office and see if the doctor you have chosen will accept you as a new patient and what types of options they offer for patients who are self-pay. Some doctors offer discounts or will set up payment plans for their patients who do not  have insurance, but you will need to ask so you aren't surprised when you get to your appointment.  2) Contact Your Local Health Department Not all health departments have doctors that can see patients for sick visits, but many do, so it is worth a call to see if yours does. If you don't know where your local health department is, you can check in your phone book. The CDC also has a tool to help you locate your state's health department, and many state websites also  have listings of all of their local health departments.  3) Find a Walk-in Clinic If your illness is not likely to be very severe or complicated, you may want to try a walk in clinic. These are popping up all over the country in pharmacies, drugstores, and shopping centers. They're usually staffed by nurse practitioners or physician assistants that have been trained to treat common illnesses and complaints. They're usually fairly quick and inexpensive. However, if you have serious medical issues or chronic medical problems, these are probably not your best option.  No Primary Care Doctor: - Call Health Connect at  [phone] - they can help you locate a primary care doctor that  accepts your insurance, provides certain services, etc. - Physician Referral Service- [phone]  Chronic Pain Problems: Organization         Address  Phone   Notes  [facility]  [phone] Patients need to be referred by their primary care doctor.   Medication Assistance: Organization         Address  Phone   Notes  [facility] Medication [facility] [address] [place] [place], [place] [address] [phone] --Must be a resident of [facility] -- Must have NO insurance coverage whatsoever (no Medicaid/ Medicare, etc.) -- The pt. MUST have a primary care doctor that directs their care regularly and follows them in the community   [facility]ist  [phone]   [employer]  [phone]    Agencies that provide inexpensive medical care: Organization         Address  Phone   Notes  [facility]  [phone]   [facility]    [phone]   [facility] [address] [place], [place] [address] [phone]   [facility] [address] [place] [place] [phone]   Planned Parenthood    [phone]   [facility]    [phone]   [facility]  [address], [place] Phone:  [phone], Fax:  [phone] Hours of Operation:  9 am - 6 pm, M-F.  Also accepts Medicaid/Medicare and self-pay.  [facility]  [address], [place] Phone: [phone], Fax: [phone]. Hours of Operation:  8:30 am - 5:30 pm, M-F.  Also accepts Medicaid and self-pay.  [facility] [place] [address] [place] Phone: [phone]   [facility] [address] Natasha Bence [place], [place] [phone], Ext. [phone] Mondays & Thursdays: 7-9 AM.  First 15 patients are seen on a first come, first serve basis.    Medicaid-accepting [facility] Providers:  Organization         Address  Phone   Notes  [facility] [address],  [phone] Also accepts self-pay patients.  [facility] [place] [address], [place] ° ([phone]   °[facility] [address], [place] [phone]   °[facility] [address] [place] [address], [place] [phone]   °Veita Bland [address], [place]  ° [phone] Only accepts [place] Access Medicaid patients after they have their name applied to their card.  ° °Self-Pay (no insurance) in [place]: ° °Organization         Address  Phone   Notes  °Sickle Cell Patients, [facility] [address], [place] [phone]   °[facility] [address], [place] [phone]   ° [facility] [place] ° [address], [place] [phone]   °[facility]/Dr. Osei-Bonsu ° [address] [place] [address], [place] or [address], [place] [phone] Phone number for both [place] and [place] locations is the same.  °[facility] [address], [place] [phone]   °[facility] [address] [place] [address],  [place] or [address] [phone] °([phone]) [phone]   °[facility] [address], [place] ([phone]) [phone], phone; [phone], fax Sees patients 1st and 3rd Saturday of every month.  Must not qualify for public or private insurance (i.e. Medicaid, Medicare, [place] Health Choice, Veterans' Benefits) • Household income should be no more than 200% of the poverty level •The clinic cannot treat you if you are pregnant or think you are pregnant • Sexually transmitted diseases are not treated at the clinic.  ° ° °Dental Care: °Organization         Address  Phone  Notes  °[place] [facility] [address],  ([phone]) [phone] Accepts children up to age 21 who are enrolled in Medicaid or [place] Health Choice; pregnant women with a Medicaid card; and children who have applied for Medicaid or [place] Health Choice, but were declined, whose parents can pay a reduced fee at time of service.  °[place] [facility] [place]  [address], [place] [phone]) [phone] Accepts children up to age 21 who are enrolled in Medicaid or [place] Health Choice; pregnant women with a Medicaid card; and children who have applied for Medicaid or  Health Choice, but were declined, whose parents can pay a reduced fee at time of service.  °[facility] ° [address],  ([phone] Patients are seen by appointment only. Walk-ins are not accepted. [facility] will see patients 18 years of age and older. °Monday - Tuesday (8am-5pm) °Most Wednesdays (8:30-5pm) °$30 per visit, cash only  °[facility] ° [address], [place] [phone] Patients are seen by appointment only. Walk-ins are not accepted. [facility] will see patients 18 years of age and older. °One Wednesday Evening (Monthly: Volunteer Based).  $30 per visit, cash only  °[facility]  [phone] for adults; Children under age 4, call Graduate Pediatric Dentistry at [phone]. Children aged 4-14, please call [phone] to request a pediatric application. ° Dental services are provided in all areas of dental care including fillings, crowns and bridges, complete and partial dentures, implants, gum treatment, root canals, and extractions. Preventive care is also provided. Treatment is provided to both adults and children. °Patients are selected   via a lottery and there is often a waiting list.   [facility] [address], [place]  [phone] [email or web address]   [facility] [address] [place], [place] [phone], Ext. [phone] Second and Fourth Thursday of each month, opens at 6:30 AM; Clinic ends at 9 AM.  Patients are seen on a first-come first-served basis, and a limited number are seen during each clinic.   [facility]  [address] Hillard Danker [place], [place] [phone]   Eligibility Requirements You must have lived in [place], [place], or [place] counties for at least the last three months.   You cannot be eligible for state or federal sponsored [employer], including [employer], [place], or [employer].   You generally cannot be eligible for healthcare insurance through your employer.    How to apply: Eligibility screenings are held every Tuesday and Wednesday afternoon from 1:00 pm until 4:00 pm. You do not need an appointment for the interview!  [facility] [address], [place], [place]   [place]  [place] Department  [place]  [phone]    Behavioral Health Resources in the Community: Intensive Outpatient Programs Organization         Address  Phone  Notes  [facility] [address] [place], [place] [phone]     [facility] Outpatient [address] [place], [place]   [facility]: [facility] [address] [place], [place]   [place] [address],  [place], [place] or [phone]   Substance Abuse Resources Organization         Address  Phone  Notes  [facility]  [phone]   [place]  [phone]   [facility]   Chinita Pester  [phone]   Residential & Outpatient Substance Abuse Program  [phone]   Psychological Services Organization         Address  Phone  Notes  [facility] [place]  [place]  [phone]   [place] [address], [place] or [phone]    Mobile Crisis Teams Organization         Address  Phone  Notes  [facility], Mobile Crisis Care Unit  [phone]   [facility]  [address] [place], [place]   [place] [address], [place] [phone]    Self-Help/Support Groups Organization         Address  Phone             Notes  [place]. of [place] - variety of support groups  [place] Call for more information  Narcotics Anonymous (NA), [facility] [address], [employer] [place]  2 meetings at this location   Special educational needs teacher         Address  Phone  Notes  [facility] [place],    [place]  [phone])[phone]   [facility]  [address] [place] 024097, [place], [place]   [place], [place] [phone] Admissions: 8am-3pm M-F  Incentives Substance [place] [address],    [place], [place]   [facility]  Starling Manns [place], [place] [phone]   [facility] [address],  [place],  [place] [phone]   [facility] - Intensive Outpatient [address], [place], [place] [phone]   [facility] ([facility] [address] [place]  [place], [place] [phone] or [phone]   [facility] ([facility]) [address] [place], [place] [phone] Accepts Medicaid  [facility] [place] [address],  [place] [phone] Substance Abuse/Addiction Treatment   [facility] Organization         Address  Phone  Notes  [facility]  [phone]   Angie Fava, PhD [address] Ervin Knack [place], [place]   [phone] or [phone]   [facility] Behavioral   [address] [place], [place] [phone]   [facility] [address] [place], [place] [phone] Insurance/Medicaid/sponsorship through [facility] and Families [address]                                    [place], [place] [phone] Therapy/tele-psych/case  [facility] [address], [place] [phone]    Dr. Lolly Mustache  [phone]   [facility]  [employer] [facility] Dept. 1) [address] [place] 2) [address] [place] 3)  [address], [place] [phone]  [phone]   [facility] Child Abuse Hotline [phone] or [phone] (After Hours)

## 2014-08-17 NOTE — ED Notes (Signed)
Patient transported to X-ray 

## 2014-08-17 NOTE — ED Notes (Signed)
Pt has a history of high BP, stated she has not been taking her meds due to insurance complications.

## 2014-08-17 NOTE — ED Provider Notes (Signed)
CSN: [id number]     Arrival date & time 08/17/14  1543 History  This chart was scribed for Dierdre Forth, PA-C, working with Blake Divine, MD by Chestine Spore, ED Scribe. The patient was seen in room TR11C/TR11C at 4:17 PM.    Chief Complaint  Patient presents with  . Back Pain      The history is provided by the patient. No language interpreter was used.    HPI Comments: [name] [name] is a 54 y.o. female with a medical hx of HTN, fibromyalgia, and chronic back pain, and bulging disc in lower back,  who presents to the Emergency Department complaining of low back pain onset 2 weeks. Pt reports that she heard a pop in her lower back 2 weeks ago when she was moving.  Pt reports that her low back pain began the day after she moved.  She reports that the back pain does radiate to her bilateral legs to her pains. Pt notes that her pain is also in her lower abdominal area. Pt notes that her low back pain is worsened with sitting down and standing up straight. Pt is able to ambulate but not without pain. Pt notes that she is having associated symptoms of dysuria. She states that she has tried tylenol 500 mg with no relief for her symptoms. Pt denies bowel/bladder incontinence, numbness, gait problem, hematuria, and any other symptoms. Pt has left sided hip surgery for her arthritis but no back surgeries. Pt has not been taking her HTN medications due to issues with her insurance.   Past Medical History  Diagnosis Date  . Fibromyalgia   . Hypertension   . Arthritis   . Chronic back pain    Past Surgical History  Procedure Laterality Date  . Neck surgery     No family history on file. History  Substance Use Topics  . Smoking status: Never Smoker   . Smokeless tobacco: Not on file  . Alcohol Use: Yes   OB History    No data available     Review of Systems  Gastrointestinal:       No bowel incontinence  Genitourinary: Positive for dysuria. Negative for hematuria.       No  bladder incontinence  Musculoskeletal: Positive for back pain. Negative for gait problem.  Neurological: Negative for numbness.      Allergies  Review of patient's allergies indicates no known allergies.  Home Medications   Prior to Admission medications   Medication Sig Start Date End Date Taking? Authorizing Provider  acetaminophen (TYLENOL) 500 MG tablet Take 1,000 mg by mouth every 6 (six) hours as needed (pain).    Historical Provider, MD  atorvastatin (LIPITOR) 20 MG tablet Take 20 mg by mouth daily. 02/13/14   Historical Provider, MD  citalopram (CELEXA) 20 MG tablet Take 20 mg by mouth daily. 02/13/14   Historical Provider, MD  doxycycline (VIBRAMYCIN) 100 MG capsule Take 1 capsule (100 mg total) by mouth 2 (two) times daily. 04/15/14   Glynn Octave, MD  hydrochlorothiazide (HYDRODIURIL) 25 MG tablet Take 1 tablet (25 mg total) by mouth daily. 08/17/14   Myrtice Lowdermilk, PA-C  HYDROcodone-acetaminophen (NORCO/VICODIN) 5-325 MG per tablet Take 1 tablet by mouth every 6 (six) hours as needed for moderate pain or severe pain. 08/17/14   Ajdin Macke, PA-C  indomethacin (INDOCIN) 25 MG capsule Take 1 capsule (25 mg total) by mouth 3 (three) times daily as needed. Patient not taking: Reported on 04/15/2014 08/05/13   Tiffany  Neva Seat, PA-C  methocarbamol (ROBAXIN) 500 MG tablet Take 1 tablet (500 mg total) by mouth 2 (two) times daily. 08/17/14   Francoise Chojnowski, PA-C  naproxen (NAPROSYN) 500 MG tablet Take 1 tablet (500 mg total) by mouth 2 (two) times daily with a meal. 08/17/14   Audel Coakley, PA-C  omeprazole (PRILOSEC) 20 MG capsule Take 20 mg by mouth daily. 02/13/14   Historical Provider, MD  Phenyleph-Diphenhyd-DM-APAP (THERAFLU SEVERE COLD & COUGH PO) Take 30 mLs by mouth every 4 (four) hours as needed (cold and flu symptoms).    Historical Provider, MD  predniSONE (DELTASONE) 10 MG tablet Day 1 take 6 pills Day 2 take 5 pills Day 3 take 4 pills Day 4 take 3  pills Day 5 take 2 pills Day 6 take 1 pill Patient not taking: Reported on 04/15/2014 12/20/13   Courtney Forcucci, PA-C   BP 178/87 mmHg  Pulse 57  Temp(Src) 97.9 F (36.6 C) (Oral)  Resp 18  Wt 164 lb 8 oz (74.617 kg)  SpO2 100% Physical Exam  Constitutional: She appears well-developed and well-nourished. No distress.  HENT:  Head: Normocephalic and atraumatic.  Mouth/Throat: Oropharynx is clear and moist. No oropharyngeal exudate.  Eyes: Conjunctivae are normal.  Neck: Normal range of motion. Neck supple.  Full ROM without pain  Cardiovascular: Normal rate, regular rhythm and intact distal pulses.   Pulmonary/Chest: Effort normal and breath sounds normal. No respiratory distress. She has no wheezes.  Abdominal: Soft. She exhibits no distension. There is no tenderness.  Musculoskeletal:  Full range of motion of the T-spine and L-spine No tenderness to palpation of the spinous processes of the T-spine. Moderate midline L-spine tenderness. Tenderness to palpation of the bilateral paraspinous muscles of the L-spine. TTP over the left SI joint. Negative SLR.  Lymphadenopathy:    She has no cervical adenopathy.  Neurological: She is alert. She has normal reflexes.  Reflex Scores:      Bicep reflexes are 2+ on the right side and 2+ on the left side.      Brachioradialis reflexes are 2+ on the right side and 2+ on the left side.      Patellar reflexes are 2+ on the right side and 2+ on the left side.      Achilles reflexes are 2+ on the right side and 2+ on the left side. Speech is clear and goal oriented, follows commands Normal 5/5 strength in upper and lower extremities bilaterally including dorsiflexion and plantar flexion, strong and equal grip strength Sensation normal to light and sharp touch Moves extremities without ataxia, coordination intact Normal gait Normal balance No Clonus   Skin: Skin is warm and dry. No rash noted. She is not diaphoretic. No erythema.   Psychiatric: She has a normal mood and affect. Her behavior is normal.  Nursing note and vitals reviewed.   ED Course  Procedures (including critical care time) DIAGNOSTIC STUDIES: Oxygen Saturation is 100% on RA, nl by my interpretation.    COORDINATION OF CARE: 4:22 PM-Discussed treatment plan with pt at bedside and pt agreed to plan.   Labs Review Labs Reviewed  URINALYSIS, ROUTINE W REFLEX MICROSCOPIC (NOT AT [facility])    Imaging Review Dg Lumbar Spine Complete  08/17/2014   CLINICAL DATA:  Low back pain for 2 weeks, onset after moving.  EXAM: LUMBAR SPINE - COMPLETE 4+ VIEW  COMPARISON:  None.  FINDINGS: There is moderate left convex curvature centered at L2. The lumbar vertebrae are normal in height. There  is no evidence of acute fracture. There is no bone lesion or bony destruction. Mild degenerative disc changes are present at T12-L1. Moderate facet arthropathy is present at L4-5 and L5-S1. Sacroiliac joints appear unremarkable.  IMPRESSION: Curvature and degenerative changes.   Electronically Signed   By: Ellery Plunk M.D.   On: 08/17/2014 17:07     EKG Interpretation None      MDM   Final diagnoses:  Midline low back pain with sciatica, sciatica laterality unspecified  Essential hypertension  Noncompliance with medications    [name] [name] presents with acute exacerbation of chronic back pain.  No neurological deficits and normal neuro exam.  Patient can walk but states is painful.  No loss of bowel or bladder control.  No concern for cauda equina.  No fever, night sweats, weight loss, h/o cancer, IVDU.  Patient endorses back pain with urination. Urinalysis without evidence of urinary tract infection. RICE protocol and pain medicine indicated and discussed with patient.   Patient noted to be hypertensive in the emergency department.  No signs of hypertensive urgency. Patient given her home hypertension medication with improvement in blood pressure. Discussed with  patient the need for close follow-up and management by their primary care physician and [facility] and [facility]. Her home hypertension medication was refilled today.  BP 178/87 mmHg  Pulse 57  Temp(Src) 97.9 F (36.6 C) (Oral)  Resp 18  Wt 164 lb 8 oz (74.617 kg)  SpO2 100%  I personally performed the services described in this documentation, which was scribed in my presence. The recorded information has been reviewed and is accurate. Dahlia Client Kaipo Ardis, PA-C 08/17/14 0981  Blake Divine, MD 08/17/14 2017

## 2014-09-13 ENCOUNTER — Encounter: Payer: Self-pay | Admitting: Gastroenterology

## 2014-10-29 ENCOUNTER — Emergency Department (HOSPITAL_COMMUNITY): Payer: Medicaid Other

## 2014-10-29 ENCOUNTER — Emergency Department (HOSPITAL_COMMUNITY)
Admission: EM | Admit: 2014-10-29 | Discharge: 2014-10-29 | Disposition: A | Discharge status: Home / Self Care | Payer: Medicaid Other | Attending: Emergency Medicine | Admitting: Emergency Medicine

## 2014-10-29 ENCOUNTER — Encounter (HOSPITAL_COMMUNITY): Payer: Self-pay | Admitting: Emergency Medicine

## 2014-10-29 DIAGNOSIS — Z79899 Other long term (current) drug therapy: Secondary | ICD-10-CM | POA: Diagnosis not present

## 2014-10-29 DIAGNOSIS — M436 Torticollis: Secondary | ICD-10-CM | POA: Insufficient documentation

## 2014-10-29 DIAGNOSIS — M797 Fibromyalgia: Secondary | ICD-10-CM | POA: Insufficient documentation

## 2014-10-29 DIAGNOSIS — G8929 Other chronic pain: Secondary | ICD-10-CM | POA: Insufficient documentation

## 2014-10-29 DIAGNOSIS — M25511 Pain in right shoulder: Secondary | ICD-10-CM | POA: Diagnosis not present

## 2014-10-29 DIAGNOSIS — I10 Essential (primary) hypertension: Secondary | ICD-10-CM | POA: Insufficient documentation

## 2014-10-29 DIAGNOSIS — M1990 Unspecified osteoarthritis, unspecified site: Secondary | ICD-10-CM | POA: Diagnosis not present

## 2014-10-29 DIAGNOSIS — M542 Cervicalgia: Secondary | ICD-10-CM | POA: Diagnosis present

## 2014-10-29 MED ORDER — TRAMADOL HCL 50 MG PO TABS: 50.0000 mg | ORAL_TABLET | Freq: Four times a day (QID) | ORAL | Status: DC | PRN

## 2014-10-29 MED ORDER — CYCLOBENZAPRINE HCL 10 MG PO TABS: 10.0000 mg | ORAL_TABLET | Freq: Three times a day (TID) | ORAL | Status: AC | PRN

## 2014-10-29 MED ORDER — OXYCODONE-ACETAMINOPHEN 5-325 MG PO TABS
1.0000 | ORAL_TABLET | Freq: Once | ORAL | Status: AC
  Administered 2014-10-29: 1 via ORAL
  Filled 2014-10-29: qty 1

## 2014-10-29 NOTE — ED Notes (Signed)
Patient transported to X-ray 

## 2014-10-29 NOTE — ED Notes (Signed)
Dr. Knapp at the bedside.  

## 2014-10-29 NOTE — ED Notes (Signed)
Pt from home with c/o turning head the wrong way this past Wed causing immediate right neck pain.  The pt fell against a wall causing right shoulder and arm pain.  Pt reports nausea, headache, and numbness in right shoulder since.  Pt reports a "rod in my neck 2-6."  Pt in NAD, A&O.

## 2014-10-29 NOTE — ED Provider Notes (Signed)
CSN: [id number]     Arrival date & time 10/29/14  1646 History   First MD Initiated Contact with Patient 10/29/14 2053     Chief Complaint  Patient presents with  . Neck Pain  . Shoulder Pain    HPI Pt turned her head on Wednesday.   Ever since then she has been having pain on the right side of her neck.  It hurts to turn her head and she has a headache.  Pt does not have a doctor that she sees.  She came to the ED to be evaluated.  The pain is sharp and persistent.  It goes down to her shoulder. Past Medical History  Diagnosis Date  . Fibromyalgia   . Hypertension   . Arthritis   . Chronic back pain    Past Surgical History  Procedure Laterality Date  . Neck surgery     History reviewed. No pertinent family history. Social History  Substance Use Topics  . Smoking status: Never Smoker   . Smokeless tobacco: None  . Alcohol Use: Yes   OB History    No data available     Review of Systems  All other systems reviewed and are negative.     Allergies  Review of patient's allergies indicates no known allergies.  Home Medications   Prior to Admission medications   Medication Sig Start Date End Date Taking? Authorizing Provider  acetaminophen (TYLENOL) 500 MG tablet Take 1,000 mg by mouth every 6 (six) hours as needed (pain).   Yes Historical Provider, MD  cyclobenzaprine (FLEXERIL) 10 MG tablet Take 1 tablet (10 mg total) by mouth 3 (three) times daily as needed for muscle spasms. 10/29/14   Linwood Dibbles, MD  doxycycline (VIBRAMYCIN) 100 MG capsule Take 1 capsule (100 mg total) by mouth 2 (two) times daily. Patient not taking: Reported on 10/29/2014 04/15/14   Glynn Octave, MD  hydrochlorothiazide (HYDRODIURIL) 25 MG tablet Take 1 tablet (25 mg total) by mouth daily. Patient not taking: Reported on 10/29/2014 08/17/14   Dahlia Client Muthersbaugh, PA-C  HYDROcodone-acetaminophen (NORCO/VICODIN) 5-325 MG per tablet Take 1 tablet by mouth every 6 (six) hours as needed for moderate  pain or severe pain. Patient not taking: Reported on 10/29/2014 08/17/14   Dahlia Client Muthersbaugh, PA-C  indomethacin (INDOCIN) 25 MG capsule Take 1 capsule (25 mg total) by mouth 3 (three) times daily as needed. Patient not taking: Reported on 04/15/2014 08/05/13   Marlon Pel, PA-C  methocarbamol (ROBAXIN) 500 MG tablet Take 1 tablet (500 mg total) by mouth 2 (two) times daily. Patient not taking: Reported on 10/29/2014 08/17/14   Dahlia Client Muthersbaugh, PA-C  naproxen (NAPROSYN) 500 MG tablet Take 1 tablet (500 mg total) by mouth 2 (two) times daily with a meal. Patient not taking: Reported on 10/29/2014 08/17/14   Dahlia Client Muthersbaugh, PA-C  predniSONE (DELTASONE) 10 MG tablet Day 1 take 6 pills Day 2 take 5 pills Day 3 take 4 pills Day 4 take 3 pills Day 5 take 2 pills Day 6 take 1 pill Patient not taking: Reported on 04/15/2014 12/20/13   Toni Amend Forcucci, PA-C  traMADol (ULTRAM) 50 MG tablet Take 1 tablet (50 mg total) by mouth every 6 (six) hours as needed. 10/29/14   Linwood Dibbles, MD   BP 154/85 mmHg  Pulse 60  Temp(Src) 98.8 F (37.1 C) (Oral)  Resp 17  SpO2 100% Physical Exam  Constitutional: She appears well-developed and well-nourished. No distress.  HENT:  Head: Normocephalic and atraumatic.  Right  Ear: External ear normal.  Left Ear: External ear normal.  Eyes: Conjunctivae are normal. Right eye exhibits no discharge. Left eye exhibits no discharge. No scleral icterus.  Neck: Neck supple. No tracheal deviation present.  Cardiovascular: Normal rate, regular rhythm and intact distal pulses.   Pulmonary/Chest: Effort normal and breath sounds normal. No stridor. No respiratory distress. She has no wheezes. She has no rales.  Abdominal: Soft. Bowel sounds are normal. She exhibits no distension. There is no tenderness. There is no rebound and no guarding.  Musculoskeletal: She exhibits no edema.       Right shoulder: She exhibits tenderness.       Cervical back: She exhibits  tenderness and bony tenderness.  Neurological: She is alert. She has normal strength. No cranial nerve deficit (no facial droop, extraocular movements intact, no slurred speech) or sensory deficit. She exhibits normal muscle tone. She displays no seizure activity. Coordination normal.  Skin: Skin is warm and dry. No rash noted.  Psychiatric: She has a normal mood and affect.  Nursing note and vitals reviewed.   ED Course  Procedures (including critical care time) Labs Review Labs Reviewed - No data to display  Imaging Review Dg Shoulder Right  10/29/2014   CLINICAL DATA:  Acute right shoulder pain following fall 5 days ago.  EXAM: RIGHT SHOULDER - 2+ VIEW  COMPARISON:  09/03/2010  FINDINGS: There is no evidence of acute fracture, subluxation or dislocation.  No focal bony lesions are identified.  Mild degenerative changes at the [facility] joint are again identified.  IMPRESSION: No acute bony abnormalities.   Electronically Signed   By: Harmon Pier M.D.   On: 10/29/2014 21:25   Ct Head Wo Contrast  10/29/2014   CLINICAL DATA:  Patient turned her head and heard something pop in her neck glass Wednesday. Lost balance and fell into a wall, striking the shoulder. Headache and neck pain since then.  EXAM: CT HEAD WITHOUT CONTRAST  CT CERVICAL SPINE WITHOUT CONTRAST  TECHNIQUE: Multidetector CT imaging of the head and cervical spine was performed following the standard protocol without intravenous contrast. Multiplanar CT image reconstructions of the cervical spine were also generated.  COMPARISON:  CT head 04/15/2014, cervical spine 04/03/2012  FINDINGS: CT HEAD FINDINGS  The ventricles and sulci appear symmetrical. Patchy low-attenuation changes in the deep white matter consistent with white matter disease, likely small vessel ischemia. Other demyelinating or dysmyelinating process is not excluded. No mass effect or midline shift. No abnormal extra-axial fluid collections. Gray-white matter junctions are  distinct. Basal cisterns are not effaced. No evidence of acute intracranial hemorrhage. No depressed skull fractures. Visualized paranasal sinuses and mastoid air cells are not opacified. Probable old nasal bone fractures. No significant changes since previous study.  CT CERVICAL SPINE FINDINGS  Postoperative changes with anterior fusion and intervertebral disc prosthesis from C3 through C7. Fused segments appear intact. Alignment is normal. Degenerative changes at C7-T1 with narrowed interspace and endplate hypertrophic change present. Degenerative changes throughout the posterior facet joints. No vertebral compression deformities. No prevertebral soft tissue swelling. No focal bone lesion or bone destruction.  IMPRESSION: No acute intracranial abnormalities. Moderate diffuse white matter disease. No significant change since previous study.  Postoperative anterior and intervertebral fusion from C3-C7. Normal alignment. No acute displaced fractures identified.   Electronically Signed   By: Burman Nieves M.D.   On: 10/29/2014 21:59   Ct Cervical Spine Wo Contrast  10/29/2014   CLINICAL DATA:  Patient turned her head and  heard something pop in her neck glass Wednesday. Lost balance and fell into a wall, striking the shoulder. Headache and neck pain since then.  EXAM: CT HEAD WITHOUT CONTRAST  CT CERVICAL SPINE WITHOUT CONTRAST  TECHNIQUE: Multidetector CT imaging of the head and cervical spine was performed following the standard protocol without intravenous contrast. Multiplanar CT image reconstructions of the cervical spine were also generated.  COMPARISON:  CT head 04/15/2014, cervical spine 04/03/2012  FINDINGS: CT HEAD FINDINGS  The ventricles and sulci appear symmetrical. Patchy low-attenuation changes in the deep white matter consistent with white matter disease, likely small vessel ischemia. Other demyelinating or dysmyelinating process is not excluded. No mass effect or midline shift. No abnormal  extra-axial fluid collections. Gray-white matter junctions are distinct. Basal cisterns are not effaced. No evidence of acute intracranial hemorrhage. No depressed skull fractures. Visualized paranasal sinuses and mastoid air cells are not opacified. Probable old nasal bone fractures. No significant changes since previous study.  CT CERVICAL SPINE FINDINGS  Postoperative changes with anterior fusion and intervertebral disc prosthesis from C3 through C7. Fused segments appear intact. Alignment is normal. Degenerative changes at C7-T1 with narrowed interspace and endplate hypertrophic change present. Degenerative changes throughout the posterior facet joints. No vertebral compression deformities. No prevertebral soft tissue swelling. No focal bone lesion or bone destruction.  IMPRESSION: No acute intracranial abnormalities. Moderate diffuse white matter disease. No significant change since previous study.  Postoperative anterior and intervertebral fusion from C3-C7. Normal alignment. No acute displaced fractures identified.   Electronically Signed   By: Burman Nieves M.D.   On: 10/29/2014 21:59   I have personally reviewed and evaluated these images and lab results as part of my medical decision-making.   MDM   Final diagnoses:  Torticollis    Patient CT scan does not show any acute abnormality. Patient has stable postoperative changes. She does not have any evidence of fracture or dislocation of her shoulder associated with her fall. I will give her prescription for Ultram and Flexeril. She should follow up with a primary doctor or spine surgeon.   Linwood Dibbles, MD 10/29/14 2216

## 2014-10-29 NOTE — Discharge Instructions (Signed)
Acute Torticollis °Torticollis is a condition in which the muscles of the neck tighten (contract) abnormally, causing the neck to twist and the head to move into an unnatural position. Torticollis that develops suddenly is called acute torticollis. If torticollis becomes chronic and is left untreated, the face and neck can become deformed. °CAUSES °This condition may be caused by: °· Sleeping in an awkward position (common). °· Extending or twisting the neck muscles beyond their normal position. °· Infection. °In some cases, the cause may not be known. °SYMPTOMS °Symptoms of this condition include: °· An unnatural position of the head. °· Neck pain. °· A limited ability to move the neck. °· Twisting of the neck to one side. °DIAGNOSIS °This condition is diagnosed with a physical exam. You may also have imaging tests, such as an X-ray, CT scan, or MRI. °TREATMENT °Treatment for this condition involves trying to relax the neck muscles. It may include: °· Medicines or shots. °· Physical therapy. °· Surgery. This may be done in severe cases. °HOME CARE INSTRUCTIONS °· Take medicines only as directed by your health care provider. °· Do stretching exercises and massage your neck as directed by your health care provider. °· Keep all follow-up visits as directed by your health care provider. This is important. °SEEK MEDICAL CARE IF: °· You develop a fever. °SEEK IMMEDIATE MEDICAL CARE IF: °· You develop difficulty breathing. °· You develop noisy breathing (stridor). °· You start drooling. °· You have trouble swallowing or have pain with swallowing. °· You develop numbness or weakness in your hands or feet. °· You have changes in your speech, understanding, or vision. °· Your pain gets worse. °  °This information is not intended to replace advice given to you by your health care provider. Make sure you discuss any questions you have with your health care provider. °  °Document Released: 01/03/2000 Document Revised:  05/22/2014 Document Reviewed: 01/01/2014 °Elsevier Interactive Patient Education ©2016 [employer] ° °

## 2015-01-13 ENCOUNTER — Encounter (HOSPITAL_COMMUNITY): Payer: Self-pay | Admitting: Emergency Medicine

## 2015-01-13 ENCOUNTER — Emergency Department (HOSPITAL_COMMUNITY): Payer: Medicaid Other

## 2015-01-13 ENCOUNTER — Emergency Department (HOSPITAL_COMMUNITY)
Admission: EM | Admit: 2015-01-13 | Discharge: 2015-01-13 | Disposition: A | Discharge status: Home / Self Care | Payer: Medicaid Other | Attending: Emergency Medicine | Admitting: Emergency Medicine

## 2015-01-13 DIAGNOSIS — H53149 Visual discomfort, unspecified: Secondary | ICD-10-CM | POA: Insufficient documentation

## 2015-01-13 DIAGNOSIS — R42 Dizziness and giddiness: Secondary | ICD-10-CM | POA: Insufficient documentation

## 2015-01-13 DIAGNOSIS — H539 Unspecified visual disturbance: Secondary | ICD-10-CM | POA: Diagnosis not present

## 2015-01-13 DIAGNOSIS — R110 Nausea: Secondary | ICD-10-CM | POA: Diagnosis not present

## 2015-01-13 DIAGNOSIS — H5500 Unspecified nystagmus: Secondary | ICD-10-CM | POA: Insufficient documentation

## 2015-01-13 DIAGNOSIS — G8929 Other chronic pain: Secondary | ICD-10-CM | POA: Diagnosis not present

## 2015-01-13 DIAGNOSIS — Z79899 Other long term (current) drug therapy: Secondary | ICD-10-CM | POA: Insufficient documentation

## 2015-01-13 DIAGNOSIS — M1990 Unspecified osteoarthritis, unspecified site: Secondary | ICD-10-CM | POA: Insufficient documentation

## 2015-01-13 DIAGNOSIS — H748X2 Other specified disorders of left middle ear and mastoid: Secondary | ICD-10-CM | POA: Insufficient documentation

## 2015-01-13 DIAGNOSIS — R51 Headache: Secondary | ICD-10-CM | POA: Diagnosis present

## 2015-01-13 DIAGNOSIS — I10 Essential (primary) hypertension: Secondary | ICD-10-CM | POA: Diagnosis not present

## 2015-01-13 LAB — BASIC METABOLIC PANEL
ANION GAP: 11 (ref 5–15)
BUN: 30 mg/dL — ABNORMAL HIGH (ref 6–20)
CHLORIDE: 106 mmol/L (ref 101–111)
CO2: 23 mmol/L (ref 22–32)
Calcium: 8.6 mg/dL — ABNORMAL LOW (ref 8.9–10.3)
Creatinine, Ser: 1.23 mg/dL — ABNORMAL HIGH (ref 0.44–1.00)
GFR calc Af Amer: 57 mL/min — ABNORMAL LOW (ref >60)
GFR calc non Af Amer: 50 mL/min — ABNORMAL LOW (ref >60)
Glucose, Bld: 103 mg/dL — ABNORMAL HIGH (ref 65–99)
POTASSIUM: 3.9 mmol/L (ref 3.5–5.1)
SODIUM: 140 mmol/L (ref 135–145)

## 2015-01-13 LAB — CBC WITH DIFFERENTIAL/PLATELET
BASOS ABS: 0 10*3/uL (ref 0.0–0.1)
BASOS PCT: 0 %
EOS ABS: 0.1 10*3/uL (ref 0.0–0.7)
Eosinophils Relative: 2 %
HEMATOCRIT: 36.2 % (ref 36.0–46.0)
HEMOGLOBIN: 12.4 g/dL (ref 12.0–15.0)
Lymphocytes Relative: 25 %
Lymphs Abs: 2 10*3/uL (ref 0.7–4.0)
MCH: 31.1 pg (ref 26.0–34.0)
MCHC: 34.3 g/dL (ref 30.0–36.0)
MCV: 90.7 fL (ref 78.0–100.0)
MONOS PCT: 4 %
Monocytes Absolute: 0.3 10*3/uL (ref 0.1–1.0)
NEUTROS ABS: 5.5 10*3/uL (ref 1.7–7.7)
NEUTROS PCT: 69 %
Platelets: 259 10*3/uL (ref 150–400)
RBC: 3.99 MIL/uL (ref 3.87–5.11)
RDW: 11.9 % (ref 11.5–15.5)
WBC: 8 10*3/uL (ref 4.0–10.5)

## 2015-01-13 MED ORDER — SODIUM CHLORIDE 0.9 % IV BOLUS (SEPSIS)
1000.0000 mL | Freq: Once | INTRAVENOUS | Status: AC
  Administered 2015-01-13: 1000 mL via INTRAVENOUS

## 2015-01-13 MED ORDER — MECLIZINE HCL 25 MG PO TABS
25.0000 mg | ORAL_TABLET | Freq: Once | ORAL | Status: AC
  Administered 2015-01-13: 25 mg via ORAL
  Filled 2015-01-13: qty 1

## 2015-01-13 MED ORDER — MECLIZINE HCL 50 MG PO TABS: 25.0000 mg | ORAL_TABLET | Freq: Three times a day (TID) | ORAL | Status: DC | PRN

## 2015-01-13 MED ORDER — DIPHENHYDRAMINE HCL 50 MG/ML IJ SOLN
25.0000 mg | Freq: Once | INTRAMUSCULAR | Status: AC
  Administered 2015-01-13: 25 mg via INTRAVENOUS
  Filled 2015-01-13: qty 1

## 2015-01-13 MED ORDER — PROCHLORPERAZINE EDISYLATE 5 MG/ML IJ SOLN
10.0000 mg | Freq: Once | INTRAMUSCULAR | Status: AC
  Administered 2015-01-13: 10 mg via INTRAVENOUS
  Filled 2015-01-13: qty 2

## 2015-01-13 MED ORDER — KETOROLAC TROMETHAMINE 30 MG/ML IJ SOLN
30.0000 mg | Freq: Once | INTRAMUSCULAR | Status: AC
  Administered 2015-01-13: 30 mg via INTRAVENOUS
  Filled 2015-01-13: qty 1

## 2015-01-13 MED ORDER — OXYMETAZOLINE HCL 0.05 % NA SOLN: 1.0000 | Freq: Two times a day (BID) | NASAL | Status: DC

## 2015-01-13 NOTE — ED Notes (Signed)
Per pt, states he has had a headache all day-states she doe'snt have "enough spit" in her mouth

## 2015-01-13 NOTE — Discharge Instructions (Signed)
Take your medications as prescribed. Follow-up with your primary care provider in 3-4 days. Return to emergency department if symptoms worsen or new onset of fever, neck stiffness, visual changes, abdominal pain, vomiting, urinary symptoms, numbness, tingling, weakness, seizures, syncope.

## 2015-01-13 NOTE — ED Provider Notes (Signed)
CSN: [id number]     Arrival date & time 01/13/15  1755 History   First MD Initiated Contact with Patient 01/13/15 1801     Chief Complaint  Patient presents with  . Headache     (Consider location/radiation/quality/duration/timing/severity/associated sxs/prior Treatment) HPI   Patient is a 54 year old female with past medical history of hypertension and fibromyalgia who presents to the ED with complaint of headache, onset this morning. Patient reports having an intermittent dull throbbing headache to the top of her head. She notes the headache has worsened throughout the day and now is constant. Endorses associated photophobia, blurred vision, nausea, dry mouth, left ear pain and dizziness. She describes her dizziness as the room spinning and notes it occurs with movement of her head, change in position or ambulating. Denies fever, chills, neck stiffness, rhinorrhea, nasal congestion, cough, wheezing, SOB, abdominal pain, vomiting, diarrhea, urinary sxs, numbness, tingling, weakness, syncope, seizure. She endorses having hx of migraines several years ago but notes that this HA is different than her usual headaches. She notes she took extra strength tylenol without relief.    Past Medical History  Diagnosis Date  . Fibromyalgia   . Hypertension   . Arthritis   . Chronic back pain    Past Surgical History  Procedure Laterality Date  . Neck surgery     No family history on file. Social History  Substance Use Topics  . Smoking status: Never Smoker   . Smokeless tobacco: None  . Alcohol Use: Yes   OB History    No data available     Review of Systems  HENT: Positive for ear pain.   Eyes: Positive for photophobia and visual disturbance (blurred vision).  Gastrointestinal: Positive for nausea.  Neurological: Positive for dizziness and headaches.  All other systems reviewed and are negative.     Allergies  Review of patient's allergies indicates no known allergies.  Home  Medications   Prior to Admission medications   Medication Sig Start Date End Date Taking? Authorizing Provider  acetaminophen (TYLENOL) 500 MG tablet Take 1,000 mg by mouth every 6 (six) hours as needed (pain).   Yes Historical Provider, MD  cyclobenzaprine (FLEXERIL) 10 MG tablet Take 1 tablet (10 mg total) by mouth 3 (three) times daily as needed for muscle spasms. 10/29/14  Yes Linwood DibblesJon Knapp, MD  hydrochlorothiazide (HYDRODIURIL) 25 MG tablet Take 1 tablet (25 mg total) by mouth daily. 08/17/14  Yes Hannah Muthersbaugh, PA-C  traMADol (ULTRAM) 50 MG tablet Take 1 tablet (50 mg total) by mouth every 6 (six) hours as needed. Patient taking differently: Take 50 mg by mouth every 6 (six) hours as needed for moderate pain.  10/29/14  Yes Linwood DibblesJon Knapp, MD  indomethacin (INDOCIN) 25 MG capsule Take 1 capsule (25 mg total) by mouth 3 (three) times daily as needed. Patient not taking: Reported on 04/15/2014 08/05/13   Marlon Peliffany Greene, PA-C  meclizine (ANTIVERT) 50 MG tablet Take 0.5 tablets (25 mg total) by mouth 3 (three) times daily as needed. 01/13/15   Barrett HenleNicole Elizabeth Nadeau, PA-C  oxymetazoline (AFRIN NASAL SPRAY) 0.05 % nasal spray Place 1 spray into both nostrils 2 (two) times daily. 01/13/15   Satira SarkNicole Elizabeth Nadeau, PA-C   BP 101/62 mmHg  Pulse 56  Temp(Src) 98.4 F (36.9 C) (Oral)  Resp 18  SpO2 95% Physical Exam  Constitutional: She is oriented to person, place, and time. She appears well-developed and well-nourished.  HENT:  Head: Normocephalic and atraumatic.  Left Ear: A middle  ear effusion is present.  Nose: Nose normal. Right sinus exhibits no maxillary sinus tenderness and no frontal sinus tenderness. Left sinus exhibits no maxillary sinus tenderness and no frontal sinus tenderness.  Mouth/Throat: Uvula is midline, oropharynx is clear and moist and mucous membranes are normal. No oropharyngeal exudate.  Eyes: Conjunctivae are normal. Pupils are equal, round, and reactive to light. Right  eye exhibits no discharge. Left eye exhibits no discharge. No scleral icterus. Right eye exhibits nystagmus (horizontal). Left eye exhibits nystagmus (horizontal).  Neck: Normal range of motion. Neck supple.  Cardiovascular: Normal rate, regular rhythm, normal heart sounds and intact distal pulses.   Pulmonary/Chest: Effort normal and breath sounds normal. No respiratory distress. She has no wheezes. She has no rales. She exhibits no tenderness.  Abdominal: Soft. Bowel sounds are normal. She exhibits no distension and no mass. There is no tenderness. There is no rebound and no guarding.  Musculoskeletal: Normal range of motion. She exhibits no edema.  Lymphadenopathy:    She has no cervical adenopathy.  Neurological: She is alert and oriented to person, place, and time. She has normal strength and normal reflexes. No cranial nerve deficit or sensory deficit. She displays a negative Romberg sign. Coordination normal.  Unable to stand and ambulate pt due to reported dizziness.   Skin: Skin is warm and dry.  Nursing note and vitals reviewed.   ED Course  Procedures (including critical care time) Labs Review Labs Reviewed  BASIC METABOLIC PANEL - Abnormal; Notable for the following:    Glucose, Bld 103 (*)    BUN 30 (*)    Creatinine, Ser 1.23 (*)    Calcium 8.6 (*)    GFR calc non Af Amer 50 (*)    GFR calc Af Amer 57 (*)    All other components within normal limits  CBC WITH DIFFERENTIAL/PLATELET    Imaging Review Ct Head Wo Contrast  01/13/2015  CLINICAL DATA:  Headache and dizziness EXAM: CT HEAD WITHOUT CONTRAST TECHNIQUE: Contiguous axial images were obtained from the base of the skull through the vertex without intravenous contrast. COMPARISON:  11/26/2014 FINDINGS: Ventricle size is normal.  Cerebral volume normal. Patchy hypodensity in the cerebral white matter bilaterally is unchanged most consistent with chronic microvascular ischemia. Negative for acute infarct.  Negative for  hemorrhage or mass Calvarium is normal. Mild mucosal edema in the paranasal sinuses without air-fluid level. IMPRESSION: Chronic microvascular ischemic changes stable. No acute intracranial abnormality Mild mucosal edema paranasal sinuses. Electronically Signed   By: Marlan Palau M.D.   On: 01/13/2015 19:25   I have personally reviewed and evaluated these images and lab results as part of my medical decision-making.  Filed Vitals:   01/13/15 1758 01/13/15 2112  BP: 127/102 101/62  Pulse: 85 56  Temp: 98.4 F (36.9 C)   Resp: 22 18     MDM   Final diagnoses:  Vertigo   Pt presents with HA with associated nausea, photophobia, blurred vision, dizziness and left ear pain. Reports hx of migraines when she was younger but states this HA feels different. Denies fever, neck stiffness. VSS. Exam revealed bilateral ear effusions, horizontal nystagmus. No neuro deficits. Pt given IVF, and migraine cocktail. Labs showed mildly elevated Cr/BUN, otherwise unremarkable. CT head showed no acute abnormality. Pt reports her headache has mildly improved and notes her nausea has resolved. I suspect pt's sxs are likely due to peripheral vertigo. Plan to give pt meclizine and then try to ambulate pt. Pt reports her  dizziness and headache improved. Pt able to ambulate without assistance, no ataxia noted. Plan to d/c pt home. Advised pt to follow up with PCP, d/c home with meclizine and decongestant for ear effusion.  Evaluation does not show pathology requring ongoing emergent intervention or admission. Pt is hemodynamically stable and mentating appropriately. Discussed findings/results and plan with patient/guardian, who agrees with plan. All questions answered. Return precautions discussed and outpatient follow up given.         Satira Sark [place], [place] 01/14/15 0032  Loren Racer, MD 01/23/15 [phone]

## 2015-02-01 ENCOUNTER — Emergency Department (HOSPITAL_COMMUNITY)
Admission: EM | Admit: 2015-02-01 | Discharge: 2015-02-01 | Disposition: A | Discharge status: Home / Self Care | Payer: Medicaid Other | Attending: Emergency Medicine | Admitting: Emergency Medicine

## 2015-02-01 ENCOUNTER — Emergency Department (HOSPITAL_COMMUNITY): Payer: Medicaid Other

## 2015-02-01 ENCOUNTER — Encounter (HOSPITAL_COMMUNITY): Payer: Self-pay | Admitting: *Deleted

## 2015-02-01 DIAGNOSIS — M25511 Pain in right shoulder: Secondary | ICD-10-CM | POA: Insufficient documentation

## 2015-02-01 DIAGNOSIS — I10 Essential (primary) hypertension: Secondary | ICD-10-CM | POA: Insufficient documentation

## 2015-02-01 DIAGNOSIS — M797 Fibromyalgia: Secondary | ICD-10-CM | POA: Diagnosis not present

## 2015-02-01 DIAGNOSIS — Z981 Arthrodesis status: Secondary | ICD-10-CM | POA: Insufficient documentation

## 2015-02-01 DIAGNOSIS — G8929 Other chronic pain: Secondary | ICD-10-CM | POA: Insufficient documentation

## 2015-02-01 DIAGNOSIS — M1990 Unspecified osteoarthritis, unspecified site: Secondary | ICD-10-CM | POA: Insufficient documentation

## 2015-02-01 DIAGNOSIS — M62838 Other muscle spasm: Secondary | ICD-10-CM | POA: Insufficient documentation

## 2015-02-01 DIAGNOSIS — M25512 Pain in left shoulder: Secondary | ICD-10-CM | POA: Diagnosis not present

## 2015-02-01 DIAGNOSIS — Z79899 Other long term (current) drug therapy: Secondary | ICD-10-CM | POA: Diagnosis not present

## 2015-02-01 DIAGNOSIS — M542 Cervicalgia: Secondary | ICD-10-CM | POA: Insufficient documentation

## 2015-02-01 MED ORDER — DIAZEPAM 5 MG/ML IJ SOLN
5.0000 mg | Freq: Once | INTRAMUSCULAR | Status: AC
  Administered 2015-02-01: 5 mg via INTRAMUSCULAR
  Filled 2015-02-01: qty 2

## 2015-02-01 MED ORDER — ONDANSETRON 4 MG PO TBDP
4.0000 mg | ORAL_TABLET | Freq: Once | ORAL | Status: AC
  Administered 2015-02-01: 4 mg via ORAL
  Filled 2015-02-01: qty 1

## 2015-02-01 MED ORDER — OXYCODONE-ACETAMINOPHEN 5-325 MG PO TABS
1.0000 | ORAL_TABLET | Freq: Once | ORAL | Status: AC
  Administered 2015-02-01: 1 via ORAL
  Filled 2015-02-01: qty 1

## 2015-02-01 MED ORDER — DIAZEPAM 5 MG PO TABS: 5.0000 mg | ORAL_TABLET | Freq: Four times a day (QID) | ORAL | Status: DC | PRN

## 2015-02-01 MED ORDER — IBUPROFEN 400 MG PO TABS
800.0000 mg | ORAL_TABLET | Freq: Once | ORAL | Status: AC
  Administered 2015-02-01: 800 mg via ORAL
  Filled 2015-02-01: qty 2

## 2015-02-01 MED ORDER — IBUPROFEN 800 MG PO TABS: 800.0000 mg | ORAL_TABLET | Freq: Three times a day (TID) | ORAL | Status: DC

## 2015-02-01 MED ORDER — OXYCODONE-ACETAMINOPHEN 5-325 MG PO TABS: 1.0000 | ORAL_TABLET | ORAL | Status: DC | PRN

## 2015-02-01 MED ORDER — OXYCODONE-ACETAMINOPHEN 5-325 MG PO TABS
1.0000 | ORAL_TABLET | Freq: Once | ORAL | Status: AC
Start: 2015-02-01 — End: 2015-02-01
  Administered 2015-02-01: 1 via ORAL
  Filled 2015-02-01: qty 1

## 2015-02-01 NOTE — ED Provider Notes (Signed)
CSN: [id number]     Arrival date & time 02/01/15  1805 History  By signing my name below, I, Doreatha Martin, attest that this documentation has been prepared under the direction and in the presence of Cheri Fowler, PA-C. Electronically Signed: Doreatha Martin, ED Scribe. 02/01/2015. 6:42 PM.    Chief Complaint  Patient presents with  . Neck Pain   The history is provided by the patient. No language interpreter was used.   HPI Comments: [name] [name] is a 55 y.o. female with h/o fibromyalgia, HTN, arthritis, chronic back pain, neck surgery who presents to the Emergency Department complaining of moderate neck pain onset this morning and worsened this evening. She states associated shoulder pain, decreased ROM secondary to pain, numbness and paresthesia of bilateral arms. She reports that her pain is worsened with shoulder movement, neck movements, swallowing and speaking. Pt states she took Tylenol extra strength this morning, but none since. She notes h/o similar symptoms, but not as severe. No noted recent falls, injury, trauma. Pt states she did not take her BP medication this morning. PCP is Dr. Sondra Come. She denies CP, SOB, N/V, abdominal pain.  Past Medical History  Diagnosis Date  . Fibromyalgia   . Hypertension   . Arthritis   . Chronic back pain    Past Surgical History  Procedure Laterality Date  . Neck surgery     No family history on file. Social History  Substance Use Topics  . Smoking status: Never Smoker   . Smokeless tobacco: None  . Alcohol Use: Yes   OB History    No data available     Review of Systems A complete 10 system review of systems was obtained and all systems are negative except as noted in the HPI and PMH.    Allergies  Review of patient's allergies indicates no known allergies.  Home Medications   Prior to Admission medications   Medication Sig Start Date End Date Taking? Authorizing Provider  acetaminophen (TYLENOL) 500 MG tablet Take 1,000 mg by mouth  every 6 (six) hours as needed (pain).    Historical Provider, MD  cyclobenzaprine (FLEXERIL) 10 MG tablet Take 1 tablet (10 mg total) by mouth 3 (three) times daily as needed for muscle spasms. 10/29/14   Linwood Dibbles, MD  diazepam (VALIUM) 5 MG tablet Take 1 tablet (5 mg total) by mouth every 6 (six) hours as needed for muscle spasms. 02/01/15   Cheri Fowler, PA-C  hydrochlorothiazide (HYDRODIURIL) 25 MG tablet Take 1 tablet (25 mg total) by mouth daily. 08/17/14   Hannah Muthersbaugh, PA-C  ibuprofen (ADVIL,MOTRIN) 800 MG tablet Take 1 tablet (800 mg total) by mouth 3 (three) times daily. 02/01/15   Cheri Fowler, PA-C  indomethacin (INDOCIN) 25 MG capsule Take 1 capsule (25 mg total) by mouth 3 (three) times daily as needed. Patient not taking: Reported on 04/15/2014 08/05/13   Marlon Pel, PA-C  meclizine (ANTIVERT) 50 MG tablet Take 0.5 tablets (25 mg total) by mouth 3 (three) times daily as needed. 01/13/15   Barrett Henle, PA-C  oxyCODONE-acetaminophen (PERCOCET/ROXICET) 5-325 MG tablet Take 1-2 tablets by mouth every 4 (four) hours as needed for severe pain. 02/01/15   Cheri Fowler, PA-C  oxymetazoline (AFRIN NASAL SPRAY) 0.05 % nasal spray Place 1 spray into both nostrils 2 (two) times daily. 01/13/15   Barrett Henle, PA-C  traMADol (ULTRAM) 50 MG tablet Take 1 tablet (50 mg total) by mouth every 6 (six) hours as needed. Patient taking  differently: Take 50 mg by mouth every 6 (six) hours as needed for moderate pain.  10/29/14   Linwood DibblesJon Knapp, MD   BP 175/100 mmHg  Pulse 79  Temp(Src) 98.3 F (36.8 C) (Oral)  Resp 20  Ht 5\' 1"  (1.549 m)  Wt 75.751 kg  BMI 31.57 kg/m2  SpO2 97% Physical Exam  Constitutional: She is oriented to person, place, and time. She appears well-developed and well-nourished.  HENT:  Head: Normocephalic and atraumatic.  Eyes: Conjunctivae and EOM are normal. Pupils are equal, round, and reactive to light.  Neck: Neck supple.  No meningismus    Cardiovascular: Normal rate, regular rhythm and normal heart sounds.  Exam reveals no gallop and no friction rub.   No murmur heard. Pulmonary/Chest: Effort normal and breath sounds normal. No respiratory distress. She has no wheezes. She has no rales. She exhibits no tenderness.  Abdominal: Soft. Bowel sounds are normal. She exhibits no distension. There is no tenderness.  Musculoskeletal: She exhibits tenderness.       Cervical back: She exhibits decreased range of motion, tenderness, bony tenderness, pain and spasm. She exhibits no edema, no deformity, no laceration and normal pulse.       Back:  Decreased C-spine ROM with flexion, extension, lateral flexion and rotation secondary to pain. Spasm noted. Significant cervical midline and paraspinal tenderness.   Neurological: She is alert and oriented to person, place, and time.  Strength and sensation intact bilaterally throughout the upper extremities.   Skin: Skin is warm and dry.  Psychiatric: She has a normal mood and affect. Her behavior is normal.  Nursing note and vitals reviewed.  ED Course  Procedures (including critical care time) DIAGNOSTIC STUDIES: Oxygen Saturation is 100% on RA, normal by my interpretation.    COORDINATION OF CARE: 6:36 PM Discussed treatment plan with pt at bedside which includes XR, pain management and pt agreed to plan.   Imaging Review Dg Cervical Spine Complete  02/01/2015  CLINICAL DATA:  Acute neck pain without known injury. EXAM: CERVICAL SPINE - COMPLETE 4+ VIEW COMPARISON:  CT scan of November 26, 2014 FINDINGS: Status post surgical anterior fusion of C3, C4, C5, C6 and C7 with interbody fusion. No fracture or significant spondylolisthesis is noted. Mild diffuse osteopenia is noted. IMPRESSION: Extensive postsurgical changes as described above. No acute abnormality seen in the cervical spine. Electronically Signed   By: Lupita RaiderJames  Green Jr, M.D.   On: 02/01/2015 19:42   I have personally reviewed and  evaluated these images as part of my medical decision-making.   MDM   Final diagnoses:  Neck pain  Neck muscle spasm   Filed Vitals:   02/01/15 1826 02/01/15 2016  BP: 219/124 175/100  Pulse:  79  Temp:  98.3 F (36.8 C)  Resp:  20     Meds given in ED:  Medications  oxyCODONE-acetaminophen (PERCOCET/ROXICET) 5-325 MG per tablet 1 tablet (1 tablet Oral Given 02/01/15 1849)  diazepam (VALIUM) injection 5 mg (5 mg Intramuscular Given 02/01/15 1850)  ibuprofen (ADVIL,MOTRIN) tablet 800 mg (800 mg Oral Given 02/01/15 1850)  oxyCODONE-acetaminophen (PERCOCET/ROXICET) 5-325 MG per tablet 1 tablet (1 tablet Oral Given 02/01/15 2032)    New Prescriptions   DIAZEPAM (VALIUM) 5 MG TABLET    Take 1 tablet (5 mg total) by mouth every 6 (six) hours as needed for muscle spasms.   IBUPROFEN (ADVIL,MOTRIN) 800 MG TABLET    Take 1 tablet (800 mg total) by mouth 3 (three) times daily.  OXYCODONE-ACETAMINOPHEN (PERCOCET/ROXICET) 5-325 MG TABLET    Take 1-2 tablets by mouth every 4 (four) hours as needed for severe pain.     Pt with h/o chronic neck pain and neck surgery presenting for evaluation of neck pain onset today. No meningismus. XR C-spine showed extensive postsurgical changes s/p surgical anterior fusion of C3, C4, C5, C6 and C7 with interbody fusion. No fracture or significant spondylolisthesis was noted. Pain management in the ED with Valium and Percocet. Will place in cervical soft collar. Will provide referral to orthopedist. BP on triage was 224/108. Pt reports h/o HTN and endorses not taking her medication this morning. Pt advised to follow up with Orthopedist.  Patient will be discharged home & is agreeable with above plan. Returns precautions discussed. Pt appears safe for discharge.   I personally performed the services described in this documentation, which was scribed in my presence. The recorded information has been reviewed and is accurate.   Cheri Fowler, PA-C 02/01/15  2056  Linwood Dibbles, MD 02/02/15 [id number]

## 2015-02-01 NOTE — ED Notes (Signed)
The pt reports that her pain is only a little bvetter

## 2015-02-01 NOTE — ED Notes (Signed)
The pt is c/o neck pain that started this am worse for one hour.  No known injurt

## 2015-02-01 NOTE — ED Notes (Signed)
Pt ready for d/c  C/o nausea

## 2015-02-01 NOTE — ED Notes (Signed)
The pt reports that her pain is no better.  She just returned from xray

## 2015-02-01 NOTE — Discharge Instructions (Signed)
Acute Torticollis °Torticollis is a condition in which the muscles of the neck tighten (contract) abnormally, causing the neck to twist and the head to move into an unnatural position. Torticollis that develops suddenly is called acute torticollis. If torticollis becomes chronic and is left untreated, the face and neck can become deformed. °CAUSES °This condition may be caused by: °· Sleeping in an awkward position (common). °· Extending or twisting the neck muscles beyond their normal position. °· Infection. °In some cases, the cause may not be known. °SYMPTOMS °Symptoms of this condition include: °· An unnatural position of the head. °· Neck pain. °· A limited ability to move the neck. °· Twisting of the neck to one side. °DIAGNOSIS °This condition is diagnosed with a physical exam. You may also have imaging tests, such as an X-ray, CT scan, or MRI. °TREATMENT °Treatment for this condition involves trying to relax the neck muscles. It may include: °· Medicines or shots. °· Physical therapy. °· Surgery. This may be done in severe cases. °HOME CARE INSTRUCTIONS °· Take medicines only as directed by your health care provider. °· Do stretching exercises and massage your neck as directed by your health care provider. °· Keep all follow-up visits as directed by your health care provider. This is important. °SEEK MEDICAL CARE IF: °· You develop a fever. °SEEK IMMEDIATE MEDICAL CARE IF: °· You develop difficulty breathing. °· You develop noisy breathing (stridor). °· You start drooling. °· You have trouble swallowing or have pain with swallowing. °· You develop numbness or weakness in your hands or feet. °· You have changes in your speech, understanding, or vision. °· Your pain gets worse. °  °This information is not intended to replace advice given to you by your health care provider. Make sure you discuss any questions you have with your health care provider. °  °Document Released: 01/03/2000 Document Revised:  05/22/2014 Document Reviewed: 01/01/2014 °Elsevier Interactive Patient Education ©2016 [employer] ° °

## 2015-02-22 ENCOUNTER — Other Ambulatory Visit: Payer: Self-pay | Admitting: Orthopedic Surgery

## 2015-02-22 DIAGNOSIS — G8929 Other chronic pain: Secondary | ICD-10-CM

## 2015-02-22 DIAGNOSIS — M542 Cervicalgia: Principal | ICD-10-CM

## 2015-03-04 ENCOUNTER — Ambulatory Visit
Admission: RE | Admit: 2015-03-04 | Discharge: 2015-03-04 | Disposition: A | Discharge status: Home / Self Care | Payer: Medicaid Other | Source: Ambulatory Visit | Attending: Orthopedic Surgery | Admitting: Orthopedic Surgery

## 2015-03-04 DIAGNOSIS — G8929 Other chronic pain: Secondary | ICD-10-CM

## 2015-03-04 DIAGNOSIS — M542 Cervicalgia: Principal | ICD-10-CM

## 2015-03-04 MED ORDER — IOHEXOL 300 MG/ML  SOLN
10.0000 mL | Freq: Once | INTRAMUSCULAR | Status: AC | PRN
  Administered 2015-03-04: 10 mL via INTRATHECAL

## 2015-03-04 MED ORDER — MEPERIDINE HCL 100 MG/ML IJ SOLN
75.0000 mg | Freq: Once | INTRAMUSCULAR | Status: AC
  Administered 2015-03-04: 75 mg via INTRAMUSCULAR

## 2015-03-04 MED ORDER — DIAZEPAM 5 MG PO TABS
10.0000 mg | ORAL_TABLET | Freq: Once | ORAL | Status: AC
  Administered 2015-03-04: 10 mg via ORAL

## 2015-03-04 MED ORDER — ONDANSETRON HCL 4 MG/2ML IJ SOLN
4.0000 mg | Freq: Once | INTRAMUSCULAR | Status: AC
  Administered 2015-03-04: 4 mg via INTRAMUSCULAR

## 2015-03-04 NOTE — Progress Notes (Signed)
Patient states she has been off Tramadol for at least the past two days. 

## 2015-03-04 NOTE — Discharge Instructions (Signed)
Myelogram Discharge Instructions  1. Go home and rest quietly for the next 24 hours.  It is important to lie flat for the next 24 hours.  Get up only to go to the restroom.  You may lie in the bed or on a couch on your back, your stomach, your left side or your right side.  You may have one pillow under your head.  You may have pillows between your knees while you are on your side or under your knees while you are on your back.  2. DO NOT drive today.  Recline the seat as far back as it will go, while still wearing your seat belt, on the way home.  3. You may get up to go to the bathroom as needed.  You may sit up for 10 minutes to eat.  You may resume your normal diet and medications unless otherwise indicated.  Drink lots of extra fluids today and tomorrow.  4. The incidence of headache, nausea, or vomiting is about 5% (one in 20 patients).  If you develop a headache, lie flat and drink plenty of fluids until the headache goes away.  Caffeinated beverages may be helpful.  If you develop severe nausea and vomiting or a headache that does not go away with flat bed rest, call [phone].  5. You may resume normal activities after your 24 hours of bed rest is over; however, do not exert yourself strongly or do any heavy lifting tomorrow. If when you get up you have a headache when standing, go back to bed and force fluids for another 24 hours.  6. Call your physician for a follow-up appointment.  The results of your myelogram will be sent directly to your physician by the following day.  7. If you have any questions or if complications develop after you arrive home, please call [phone].  Discharge instructions have been explained to the patient.  The patient, or the person responsible for the patient, fully understands these instructions.        May resume Tramadol on Feb. 14, 2017, after 1:00 pm.

## 2015-10-28 ENCOUNTER — Emergency Department (HOSPITAL_COMMUNITY)
Admission: EM | Admit: 2015-10-28 | Discharge: 2015-10-28 | Disposition: A | Discharge status: Home / Self Care | Payer: Medicaid Other | Attending: Emergency Medicine | Admitting: Emergency Medicine

## 2015-10-28 ENCOUNTER — Encounter (HOSPITAL_COMMUNITY): Payer: Self-pay | Admitting: Emergency Medicine

## 2015-10-28 ENCOUNTER — Emergency Department (HOSPITAL_COMMUNITY): Payer: Medicaid Other

## 2015-10-28 DIAGNOSIS — I10 Essential (primary) hypertension: Secondary | ICD-10-CM | POA: Insufficient documentation

## 2015-10-28 DIAGNOSIS — Z791 Long term (current) use of non-steroidal anti-inflammatories (NSAID): Secondary | ICD-10-CM | POA: Insufficient documentation

## 2015-10-28 DIAGNOSIS — M25511 Pain in right shoulder: Secondary | ICD-10-CM

## 2015-10-28 DIAGNOSIS — G8929 Other chronic pain: Secondary | ICD-10-CM | POA: Diagnosis not present

## 2015-10-28 DIAGNOSIS — Z79899 Other long term (current) drug therapy: Secondary | ICD-10-CM | POA: Insufficient documentation

## 2015-10-28 DIAGNOSIS — M19011 Primary osteoarthritis, right shoulder: Secondary | ICD-10-CM | POA: Diagnosis not present

## 2015-10-28 DIAGNOSIS — M19019 Primary osteoarthritis, unspecified shoulder: Secondary | ICD-10-CM

## 2015-10-28 MED ORDER — NAPROXEN 500 MG PO TABS: 500.0000 mg | ORAL_TABLET | Freq: Two times a day (BID) | ORAL | 0 refills | Status: DC

## 2015-10-28 MED ORDER — ACETAMINOPHEN 500 MG PO TABS
500.0000 mg | ORAL_TABLET | Freq: Four times a day (QID) | ORAL | 0 refills | Status: DC | PRN
Start: 2015-10-28 — End: 2018-10-10

## 2015-10-28 NOTE — Discharge Instructions (Signed)
Start taking Naproxen twice daily.  You may also take Tylenol every 6 hours for pain.  Do not exceed 4 grams in 24 hours.  Apply ice for 15-20 minutes three times daily.  For persistent symptoms follow up with orthopedics.
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
## 2015-10-28 NOTE — ED Triage Notes (Addendum)
Pt presents to ED c/o R shoulder pain x 1 year, no injury noted. Pt brought daughter in to be checked out and thought "I never get a chance to get looked at so this works out." Pain worse with movement

## 2015-11-11 ENCOUNTER — Ambulatory Visit (INDEPENDENT_AMBULATORY_CARE_PROVIDER_SITE_OTHER): Payer: Medicaid Other | Admitting: Orthopaedic Surgery

## 2015-11-25 ENCOUNTER — Ambulatory Visit (INDEPENDENT_AMBULATORY_CARE_PROVIDER_SITE_OTHER): Payer: Medicaid Other | Admitting: Orthopaedic Surgery

## 2015-11-25 DIAGNOSIS — G8929 Other chronic pain: Secondary | ICD-10-CM | POA: Diagnosis not present

## 2015-11-25 DIAGNOSIS — M25511 Pain in right shoulder: Secondary | ICD-10-CM

## 2015-11-25 NOTE — Progress Notes (Signed)
Office Visit Note   Patient: [name] [name]           Date of Birth: [birth year]           MRN: [id number] Visit Date: 11/25/2015              Requested by: No referring provider defined for this encounter. PCP: No PCP Per Patient   Assessment & Plan: Visit Diagnoses:  1. Chronic right shoulder pain     Plan:  - subacromial inj given - NCV ordered for right hand - f/u 3 weeks to recheck shoulder and NCV  Follow-Up Instructions: Return in about 3 weeks (around 12/16/2015) for recheck right shoulder and NCV.   Orders:  Orders Placed This Encounter  Procedures  . Large Joint Injection/Arthrocentesis  . Ambulatory referral to Physical Medicine Rehab   No orders of the defined types were placed in this encounter.     Procedures: Large Joint Inj Date/Time: 11/25/2015 10:26 AM Performed by: Tarry KosXU, NAIPING M Authorized by: Tarry KosXU, NAIPING M   Consent Given by:  Patient Timeout: prior to procedure the correct patient, procedure, and site was verified   Indications:  Pain Location:  Shoulder Site:  R subacromial bursa Prep: patient was prepped and draped in usual sterile fashion   Needle Size:  22 G Approach:  Posterior Ultrasound Guidance: No   Fluoroscopic Guidance: No       Clinical Data: No additional findings.   Subjective: Chief Complaint  Patient presents with  . Right Shoulder - Pain    55 yo female with chronic right shoulder pain for 1 month with radiation to neck and right hand.  Pain is most severe in shoulder.  Worse with activity and better with rest.  Denies injuries.  Also c/o right hand numbness s/p CTR years ago.  Pain is 8/10 and worse at night, difficulty sleeping.  Takes ibuprofen and tylenol.  Denies any focal motor weakness.  Weakness is due to pain.  Has had 4 level ACDF in the past.  She states hand pain is almost always correlated to shoulder pain.  Throbbing pain.      Review of Systems  Constitutional: Negative.   HENT: Negative.     Eyes: Negative.   Respiratory: Negative.   Cardiovascular: Negative.   Endocrine: Negative.   Musculoskeletal: Negative.   Neurological: Negative.   Hematological: Negative.   Psychiatric/Behavioral: Negative.   All other systems reviewed and are negative.    Objective: Vital Signs: There were no vitals taken for this visit.  Physical Exam  Constitutional: She is oriented to person, place, and time. She appears well-developed and well-nourished.  HENT:  Head: Atraumatic.  Eyes: EOM are normal.  Neck: Neck supple.  Cardiovascular: Intact distal pulses.   Pulmonary/Chest: Effort normal.  Abdominal: Soft.  Neurological: She is alert and oriented to person, place, and time.  Skin: Skin is warm. Capillary refill takes less than 2 seconds.  Psychiatric: She has a normal mood and affect. Her behavior is normal. Judgment and thought content normal.  Nursing note and vitals reviewed.   Right Hand Exam   Range of Motion  The patient has normal right wrist ROM.   Muscle Strength  The patient has normal right wrist strength.  Tests  Tinel's Sign (Medial Nerve): positive  Other  Sensation: normal Pulse: present  Comments:  Positive durkan's test   Right Shoulder Exam   Tenderness  The patient is experiencing tenderness in the acromioclavicular joint (globally tender).  Tests  Apprehension: negative Cross Arm: positive Drop Arm: negative Hawkin's test: positive Impingement: positive Sulcus: absent  Other  Erythema: absent Scars: absent Sensation: normal Pulse: present  Comments:  Pain seems out of proportion.  Negative spurling's.  RC grossly intact but patient endorses severe pain with testing.      Specialty Comments:  No specialty comments available.  Imaging: No results found.   PMFS History: Patient Active Problem List   Diagnosis Date Noted  . Chronic right shoulder pain 11/25/2015   Past Medical History:  Diagnosis Date  . Arthritis    . Chronic back pain   . Fibromyalgia   . Hypertension     No family history on file.  Past Surgical History:  Procedure Laterality Date  . NECK SURGERY     Social History   Occupational History  . Not on file.   Social History Main Topics  . Smoking status: Never Smoker  . Smokeless tobacco: Not on file  . Alcohol use Yes  . Drug use:     Types: Marijuana  . Sexual activity: Not on file

## 2015-12-16 ENCOUNTER — Ambulatory Visit (INDEPENDENT_AMBULATORY_CARE_PROVIDER_SITE_OTHER): Payer: Medicaid Other | Admitting: Orthopaedic Surgery

## 2015-12-16 ENCOUNTER — Encounter (INDEPENDENT_AMBULATORY_CARE_PROVIDER_SITE_OTHER): Payer: Self-pay | Admitting: Orthopaedic Surgery

## 2015-12-16 DIAGNOSIS — M542 Cervicalgia: Secondary | ICD-10-CM | POA: Diagnosis not present

## 2015-12-16 NOTE — Progress Notes (Signed)
   Office Visit Note   Patient: [name] [name]           Date of Birth: 1960/01/26           MRN: [id number] Visit Date: 12/16/2015              Requested by: No referring provider defined for this encounter. PCP: No PCP Per Patient   Assessment & Plan: Visit Diagnoses:  1. Neck pain     Plan: At this point we'll refer her back to Dr. Alvester MorinNewton for another injection of her neck hopefully this will give her some good relief. I'll see her back as needed.  Follow-Up Instructions: Return if symptoms worsen or fail to improve.   Orders:  Orders Placed This Encounter  Procedures  . Ambulatory referral to Physical Medicine Rehab   No orders of the defined types were placed in this encounter.     Procedures: No procedures performed   Clinical Data: No additional findings.   Subjective: Chief Complaint  Patient presents with  . Right Shoulder - Pain    HPI Follows up today for her right shoulder and neck pain. The injection has helped her shoulder symptoms. She still says that she continues to have neck and radicular symptoms. She has had a previous CT myelogram and nerve conduction studies which show that she did not have carpal tunnel syndrome. She did have good relief from a previous cervical epidural steroid injection by Dr. Alvester MorinNewton. Review of Systems   Objective: Vital Signs: There were no vitals taken for this visit.  Physical Exam  Ortho Exam Exam is stable. Specialty Comments:  No specialty comments available.  Imaging: No results found.   PMFS History: Patient Active Problem List   Diagnosis Date Noted  . Chronic right shoulder pain 11/25/2015   Past Medical History:  Diagnosis Date  . Arthritis   . Chronic back pain   . Fibromyalgia   . Hypertension     No family history on file.  Past Surgical History:  Procedure Laterality Date  . NECK SURGERY     Social History   Occupational History  . Not on file.   Social History Main Topics  .  Smoking status: Never Smoker  . Smokeless tobacco: Not on file  . Alcohol use Yes  . Drug use:     Types: Marijuana  . Sexual activity: Not on file

## 2015-12-30 ENCOUNTER — Encounter (INDEPENDENT_AMBULATORY_CARE_PROVIDER_SITE_OTHER): Payer: Self-pay

## 2015-12-31 ENCOUNTER — Encounter (INDEPENDENT_AMBULATORY_CARE_PROVIDER_SITE_OTHER): Payer: Self-pay

## 2016-01-23 ENCOUNTER — Telehealth (INDEPENDENT_AMBULATORY_CARE_PROVIDER_SITE_OTHER): Payer: Self-pay

## 2016-01-23 NOTE — Telephone Encounter (Signed)
PER Amy B.  "FYI-We have tried to reach this pt several times to scheduled her an appt with Dr. Alvester MorinNewton and have also mailed her a letter with no response so I am going to close this referral"

## 2016-01-31 ENCOUNTER — Telehealth (INDEPENDENT_AMBULATORY_CARE_PROVIDER_SITE_OTHER): Payer: Self-pay | Admitting: Orthopedic Surgery

## 2016-01-31 DIAGNOSIS — M542 Cervicalgia: Secondary | ICD-10-CM

## 2016-01-31 NOTE — Telephone Encounter (Signed)
See note.  Can you advise/order injection, or does she need to come in for eval first?
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
## 2016-01-31 NOTE — Telephone Encounter (Signed)
I will send her over to Dr. Alvester MorinNewton for evaluation set he can get a consult and then potentially inject her neck thanks

## 2016-02-03 NOTE — Telephone Encounter (Signed)
Referral entered, IC pt and advised

## 2016-02-10 ENCOUNTER — Emergency Department (HOSPITAL_COMMUNITY): Payer: Medicaid Other

## 2016-02-10 ENCOUNTER — Encounter (HOSPITAL_COMMUNITY): Payer: Self-pay | Admitting: Emergency Medicine

## 2016-02-10 ENCOUNTER — Emergency Department (HOSPITAL_COMMUNITY)
Admission: EM | Admit: 2016-02-10 | Discharge: 2016-02-10 | Disposition: A | Discharge status: Home / Self Care | Payer: Medicaid Other | Attending: Emergency Medicine | Admitting: Emergency Medicine

## 2016-02-10 DIAGNOSIS — I10 Essential (primary) hypertension: Secondary | ICD-10-CM | POA: Insufficient documentation

## 2016-02-10 DIAGNOSIS — Z79899 Other long term (current) drug therapy: Secondary | ICD-10-CM | POA: Insufficient documentation

## 2016-02-10 DIAGNOSIS — K2100 Gastro-esophageal reflux disease with esophagitis, without bleeding: Secondary | ICD-10-CM

## 2016-02-10 DIAGNOSIS — R0789 Other chest pain: Secondary | ICD-10-CM | POA: Diagnosis present

## 2016-02-10 DIAGNOSIS — R079 Chest pain, unspecified: Secondary | ICD-10-CM

## 2016-02-10 LAB — I-STAT TROPONIN, ED: Troponin i, poc: 0 ng/mL (ref 0.00–0.08)

## 2016-02-10 LAB — CBC
HCT: 42.9 % (ref 36.0–46.0)
Hemoglobin: 15.2 g/dL — ABNORMAL HIGH (ref 12.0–15.0)
MCH: 32.3 pg (ref 26.0–34.0)
MCHC: 35.4 g/dL (ref 30.0–36.0)
MCV: 91.1 fL (ref 78.0–100.0)
PLATELETS: 218 10*3/uL (ref 150–400)
RBC: 4.71 MIL/uL (ref 3.87–5.11)
RDW: 12.6 % (ref 11.5–15.5)
WBC: 13.4 10*3/uL — AB (ref 4.0–10.5)

## 2016-02-10 LAB — BASIC METABOLIC PANEL
Anion gap: 9 (ref 5–15)
BUN: 21 mg/dL — AB (ref 6–20)
CHLORIDE: 105 mmol/L (ref 101–111)
CO2: 25 mmol/L (ref 22–32)
CREATININE: 0.96 mg/dL (ref 0.44–1.00)
Calcium: 9 mg/dL (ref 8.9–10.3)
GFR calc Af Amer: >60 mL/min (ref >60)
Glucose, Bld: 103 mg/dL — ABNORMAL HIGH (ref 65–99)
Potassium: 3.8 mmol/L (ref 3.5–5.1)
SODIUM: 139 mmol/L (ref 135–145)

## 2016-02-10 MED ORDER — PANTOPRAZOLE SODIUM 40 MG PO TBEC
40.0000 mg | DELAYED_RELEASE_TABLET | Freq: Once | ORAL | Status: AC
  Administered 2016-02-10: 40 mg via ORAL
  Filled 2016-02-10: qty 1

## 2016-02-10 MED ORDER — PANTOPRAZOLE SODIUM 20 MG PO TBEC: 20.0000 mg | DELAYED_RELEASE_TABLET | Freq: Every day | ORAL | 3 refills | Status: DC

## 2016-02-10 MED ORDER — GI COCKTAIL ~~LOC~~
30.0000 mL | Freq: Once | ORAL | Status: AC
  Administered 2016-02-10: 30 mL via ORAL
  Filled 2016-02-10: qty 30

## 2016-02-10 NOTE — ED Provider Notes (Addendum)
[facility]-EMERGENCY DEPT Provider Note   CSN: [id number] Arrival date & time: 02/10/16  1204     History   Chief Complaint Chief Complaint  Patient presents with  . Chest Pain    HPI [name] [name] is a 56 y.o. female.  She presents for evaluation of ongoing chest discomfort, which is worse when supine, over the last week. She has a history of "ulcers," treated with antacid, several years ago. She does not smoke cigarettes. She denies shortness of breath, cough, diaphoresis, nausea or vomiting. She is not currently taking a PPI or gastric acid blocker. There are no other known modifying factors.  HPI  Past Medical History:  Diagnosis Date  . Arthritis   . Chronic back pain   . Fibromyalgia   . Hypertension     Patient Active Problem List   Diagnosis Date Noted  . Chronic right shoulder pain 11/25/2015    Past Surgical History:  Procedure Laterality Date  . NECK SURGERY      OB History    No data available       Home Medications    Prior to Admission medications   Medication Sig Start Date End Date Taking? Authorizing Provider  acetaminophen (TYLENOL) 500 MG tablet Take 1 tablet (500 mg total) by mouth every 6 (six) hours as needed. Patient taking differently: Take 500 mg by mouth every 6 (six) hours as needed for mild pain.  10/28/15  Yes Kayla Rose, PA-C  lisinopril (PRINIVIL,ZESTRIL) 20 MG tablet Take 20 mg by mouth daily.   Yes Historical Provider, MD  cyclobenzaprine (FLEXERIL) 10 MG tablet Take 1 tablet (10 mg total) by mouth 3 (three) times daily as needed for muscle spasms. Patient not taking: Reported on 02/10/2016 10/29/14   Linwood Dibbles, MD  diazepam (VALIUM) 5 MG tablet Take 1 tablet (5 mg total) by mouth every 6 (six) hours as needed for muscle spasms. Patient not taking: Reported on 02/10/2016 02/01/15   Cheri Fowler, PA-C  hydrochlorothiazide (HYDRODIURIL) 25 MG tablet Take 1 tablet (25 mg total) by mouth daily. Patient not taking: Reported on 02/10/2016  08/17/14   Dahlia Client Muthersbaugh, PA-C  ibuprofen (ADVIL,MOTRIN) 800 MG tablet Take 1 tablet (800 mg total) by mouth 3 (three) times daily. Patient not taking: Reported on 02/10/2016 02/01/15   Cheri Fowler, PA-C  indomethacin (INDOCIN) 25 MG capsule Take 1 capsule (25 mg total) by mouth 3 (three) times daily as needed. Patient not taking: Reported on 02/10/2016 08/05/13   Marlon Pel, PA-C  meclizine (ANTIVERT) 50 MG tablet Take 0.5 tablets (25 mg total) by mouth 3 (three) times daily as needed. Patient not taking: Reported on 02/10/2016 01/13/15   Barrett Henle, PA-C  naproxen (NAPROSYN) 500 MG tablet Take 1 tablet (500 mg total) by mouth 2 (two) times daily. Patient not taking: Reported on 12/16/2015 10/28/15   Cheri Fowler, PA-C  oxyCODONE-acetaminophen (PERCOCET/ROXICET) 5-325 MG tablet Take 1-2 tablets by mouth every 4 (four) hours as needed for severe pain. Patient not taking: Reported on 12/16/2015 02/01/15   Cheri Fowler, PA-C  oxymetazoline (AFRIN NASAL SPRAY) 0.05 % nasal spray Place 1 spray into both nostrils 2 (two) times daily. Patient not taking: Reported on 02/10/2016 01/13/15   Barrett Henle, PA-C  pantoprazole (PROTONIX) 20 MG tablet Take 1 tablet (20 mg total) by mouth daily. 02/10/16   Mancel Bale, MD  traMADol (ULTRAM) 50 MG tablet Take 1 tablet (50 mg total) by mouth every 6 (six) hours as needed. Patient not  taking: Reported on 02/10/2016 10/29/14   Linwood Dibbles, MD    Family History No family history on file.  Social History Social History  Substance Use Topics  . Smoking status: Never Smoker  . Smokeless tobacco: Not on file  . Alcohol use Yes     Allergies   Patient has no known allergies.   Review of Systems Review of Systems  All other systems reviewed and are negative.    Physical Exam Updated Vital Signs BP 163/97 (BP Location: Left Arm)   Pulse 87   Temp 99 F (37.2 C) (Oral)   Resp 18   Wt 169 lb 9.6 oz (76.9 kg)   SpO2 100%   BMI  32.05 kg/m   Physical Exam  Constitutional: She is oriented to person, place, and time. She appears well-developed and well-nourished. She appears distressed (She is anxious).  HENT:  Head: Normocephalic and atraumatic.  Eyes: Conjunctivae and EOM are normal. Pupils are equal, round, and reactive to light.  Neck: Normal range of motion and phonation normal. Neck supple.  Cardiovascular: Normal rate and regular rhythm.   Pulmonary/Chest: Effort normal and breath sounds normal. She exhibits no tenderness (Mild midanterior tenderness without crepitation.).  Abdominal: Soft. She exhibits no distension. There is no tenderness. There is no guarding.  Musculoskeletal: Normal range of motion. She exhibits no edema or tenderness.  Neurological: She is alert and oriented to person, place, and time. She exhibits normal muscle tone.  Skin: Skin is warm and dry.  Psychiatric: She has a normal mood and affect. Her behavior is normal. Judgment and thought content normal.  Nursing note and vitals reviewed.    ED Treatments / Results  Labs (all labs ordered are listed, but only abnormal results are displayed) Labs Reviewed  BASIC METABOLIC PANEL - Abnormal; Notable for the following:       Result Value   Glucose, Bld 103 (*)    BUN 21 (*)    All other components within normal limits  CBC - Abnormal; Notable for the following:    WBC 13.4 (*)    Hemoglobin 15.2 (*)    All other components within normal limits  I-STAT TROPOININ, ED    EKG  EKG Interpretation  Date/Time:  Monday February 10 2016 12:34:24 EST Ventricular Rate:  93 PR Interval:    QRS Duration: 100 QT Interval:  377 QTC Calculation: 469 R Axis:   57 Text Interpretation:  Sinus rhythm Low voltage, precordial leads Confirmed by Ranae Palms  MD, DAVID ([id number]) on 02/10/2016 12:38:05 PM       Radiology Dg Chest 2 View  Result Date: 02/10/2016 CLINICAL DATA:  Intermittent chest pain, nausea and shortness of breath for 2 months,  worsened over the past 2 days. EXAM: CHEST  2 VIEW COMPARISON:  CT chest and single view of the chest 03/06/2015. FINDINGS: Lungs are clear. Heart size is normal. No pneumothorax or pleural effusion. No bony abnormality. The patient is status post cervical fusion and cholecystectomy. IMPRESSION: No acute disease. Electronically Signed   By: Drusilla Kanner M.D.   On: 02/10/2016 13:36    Procedures Procedures (including critical care time)  Medications Ordered in ED Medications  gi cocktail (Maalox,Lidocaine,Donnatal) (not administered)  pantoprazole (PROTONIX) EC tablet 40 mg (not administered)     Initial Impression / Assessment and Plan / ED Course  I have reviewed the triage vital signs and the nursing notes.  Pertinent labs & imaging results that were available during my care of the  patient were reviewed by me and considered in my medical decision making (see chart for details).  Clinical Course as of Feb 10 2208  Mon Feb 10, 2016  2156 EKG 12-Lead [EW]    Clinical Course User Index [EW] Mancel BaleElliott Tura Roller, MD    Medications  gi cocktail (Maalox,Lidocaine,Donnatal) (not administered)  pantoprazole (PROTONIX) EC tablet 40 mg (not administered)    Patient Vitals for the past 24 hrs:  BP Temp Temp src Pulse Resp SpO2 Weight  02/10/16 2139 - - - - - 100 % -  02/10/16 2032 163/97 - - 87 18 98 % -  02/10/16 1444 155/94 - - 99 18 99 % -  02/10/16 1214 - - - - - - 169 lb 9.6 oz (76.9 kg)  02/10/16 1212 (!) 198/105 99 F (37.2 C) Oral 93 18 99 % -    10:10 PM Reevaluation with update and discussion. After initial assessment and treatment, an updated evaluation reveals No change in clinical status. Findings discussed with the patient and all questions answered. Milagro Belmares L    Final Clinical Impressions(s) / ED Diagnoses   Final diagnoses:  Nonspecific chest pain  Reflux esophagitis   Nonspecific chest pain with negative EGD evaluation. Doubt ACS, PE or pneumonia. Suspect  esophagitis versus gastritis. No indication for further evaluation or treatment at this time.  Nursing Notes Reviewed/ Care Coordinated Applicable Imaging Reviewed Interpretation of Laboratory Data incorporated into ED treatment  The patient appears reasonably screened and/or stabilized for discharge and I doubt any other medical condition or other [facility] requiring further screening, evaluation, or treatment in the ED at this time prior to discharge.  Plan: Home Medications- continue usual, Take Antacid AC/HS; Home Treatments- rest; return here if the recommended treatment, does not improve the symptoms; Recommended follow up- PCP prn    New Prescriptions New Prescriptions   PANTOPRAZOLE (PROTONIX) 20 MG TABLET    Take 1 tablet (20 mg total) by mouth daily.     Mancel BaleElliott Terik Haughey, MD 02/10/16 2210    Mancel BaleElliott Okey Zelek, MD 02/10/16 [phone]

## 2016-02-10 NOTE — Discharge Instructions (Signed)
Use an antacid such as Maalox or Mylanta, before meals and at bedtime.  Return here, if needed, for problems.

## 2016-02-10 NOTE — ED Notes (Addendum)
Pt reports that she has chest discomfort 8/10 burning that worsens when she lays down, and reports that pain radiates to her back. Pt reports that pain began last night. Pt does report feel nausea and 1 episode of vomiting this morning.

## 2016-02-10 NOTE — ED Notes (Signed)
Pt denies not respond when called from lobby to triage.

## 2016-02-10 NOTE — ED Triage Notes (Signed)
Pt verbalizes intermittent central chest pain with associated nausea and SOB for months. Pt describes pain and burning and stabbing.

## 2016-02-20 ENCOUNTER — Institutional Professional Consult (permissible substitution) (INDEPENDENT_AMBULATORY_CARE_PROVIDER_SITE_OTHER): Payer: Medicaid Other | Admitting: Physical Medicine and Rehabilitation

## 2016-03-05 ENCOUNTER — Ambulatory Visit (INDEPENDENT_AMBULATORY_CARE_PROVIDER_SITE_OTHER): Payer: Medicaid Other | Admitting: Physical Medicine and Rehabilitation

## 2016-03-05 ENCOUNTER — Encounter (INDEPENDENT_AMBULATORY_CARE_PROVIDER_SITE_OTHER): Payer: Self-pay | Admitting: Physical Medicine and Rehabilitation

## 2016-03-05 VITALS — BP 158/104 | HR 61

## 2016-03-05 DIAGNOSIS — G894 Chronic pain syndrome: Secondary | ICD-10-CM | POA: Diagnosis not present

## 2016-03-05 DIAGNOSIS — M542 Cervicalgia: Secondary | ICD-10-CM | POA: Insufficient documentation

## 2016-03-05 DIAGNOSIS — M797 Fibromyalgia: Secondary | ICD-10-CM | POA: Insufficient documentation

## 2016-03-05 DIAGNOSIS — M961 Postlaminectomy syndrome, not elsewhere classified: Secondary | ICD-10-CM | POA: Diagnosis not present

## 2016-03-05 DIAGNOSIS — M25511 Pain in right shoulder: Secondary | ICD-10-CM

## 2016-03-05 DIAGNOSIS — G8929 Other chronic pain: Secondary | ICD-10-CM

## 2016-03-05 NOTE — Progress Notes (Signed)
[name] [name] - 56 y.o. female MRN [id number]  Date of birth: May 06, 1960  Office Visit Note: Visit Date: 03/05/2016 PCP: [facility]erred by: [facility]  Subjective: Chief Complaint  Patient presents with  . Neck - Pain  . Right Shoulder - Pain   HPI: [name] [name] is a 56 year old female that I saw on one occasion last year for facet joint blocks of the cervical spine which she feels like may been beneficial but was very short-lived. She has a complicated history of neck and shoulder pain. She's had prior cervical fusion at multiple levels that was done at [facility] some years ago. She is seen Dr. August Saucer for her shoulder and neck pain and he ended up ordering a cervical myelogram which is reviewed below and reviewed with the patient. Her ACDF is from C3-C7. She does have adjacent level disease at C7-T1 with small right protrusion. At that time we saw her she was having more axial neck pain. She now comes in today with worsening neck and shoulder pain to the right with tingling paresthesia in a C6 distribution to the hand. She had electrodiagnostic study performed by myself in March of last year that was essentially normal without any radicular findings. Obviously that test does not rule out a sensory only radiculopathy or radiculitis. She reports no new trauma. Worsening neck which is centerline pain radiating to the right shoulder. She has pain with right shoulder internal/external rotation and lifting.The neck pain has been present for more than 2 years, the right shoulder pain has been present for a few years. Has had shoulder and neck injections, but says they didn't last long. Numbness and tingling in right hand. Pain level stays between an 8 and a 9.  She does have fibromyalgia. She doesn't get a lot of sleep at night. She does get a lot of activity taking care of grandchildren. She has had therapy in the past she's had medication management with no results.  She does have chronic pain syndrome. She denies any fevers chills or night sweats or focal weakness. She does feel weak in general.     Review of Systems  Constitutional: Negative for chills, fever, malaise/fatigue and weight loss.  HENT: Negative for hearing loss and sinus pain.   Eyes: Negative for blurred vision, double vision and photophobia.  Respiratory: Negative for cough and shortness of breath.   Cardiovascular: Negative for chest pain, palpitations and leg swelling.  Gastrointestinal: Negative for abdominal pain, nausea and vomiting.  Genitourinary: Negative for flank pain.  Musculoskeletal: Positive for back pain, joint pain and neck pain. Negative for myalgias.  Skin: Negative for itching and rash.  Neurological: Positive for tingling. Negative for tremors, focal weakness and weakness.  Endo/Heme/Allergies: Negative.   Psychiatric/Behavioral: Negative for depression.  All other systems reviewed and are negative.  Otherwise per HPI.  Assessment & Plan: Visit Diagnoses:  1. Cervicalgia   2. Post laminectomy syndrome   3. Chronic pain syndrome   4. Fibromyalgia   5. Chronic right shoulder pain     Plan: Findings:  Complicated chronic pain patient with prior ACDF and continued severe neck and shoulder pain. Exam consistent somewhat with myofascial pain and also with pain may be from the shoulder itself. She has been followed by Dr. August Saucer in the past he requested an injection a year ago which was a facet joint block that gave her may be some minimal relief. I think her symptoms are probably radicular in nature.  Fairly recent CT myelogram showing small disc protrusion. Electrodiagnostic study without severe findings of radiculopathy. I think it would be wise to complete a diagnostic L3 therapeutic cervical intralaminar epidural steroid injection. Depending on the relief that and have her follow up with Dr. August Saucer to evaluate her shoulder again. He needs to be active when she can. She  would probably benefit from sleep hygiene and particularly may be a sleep medication which was nonaddictive. She does have a new primary care physician and she'll talk to him about these issues.  I spent more than 25 minutes speaking face-to-face with the patient with 50% of the time in counseling.    Meds & Orders: No orders of the defined types were placed in this encounter.  No orders of the defined types were placed in this encounter.   Follow-up: Return for Right C7-T1 interlaminar epidural steroid injection..   Procedures: No procedures performed  No notes on file   Clinical History: 04/12/2015 Electrodiagnostic Study EMG/NCS right upper extremity normal findings.  Cervical CT myelogram  03/04/2015  IMPRESSION: 1. 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 2. Solid-appearing ACDF C3-C7. 3. Adjacent level degenerative disc disease C7-T1 with small right parasagittal protrusion, mild anterior cord flattening, bilateral osseous foraminal encroachment right greater than left.  She reports that she has never smoked. She has never used smokeless tobacco. No results for input(s): HGBA1C, LABURIC in the last 8760 hours.  Objective:  VS:  HT:    WT:   BMI:     BP:(!) 158/104  HR:61bpm  TEMP: ( )  RESP:  Physical Exam  Constitutional: She is oriented to person, place, and time. She appears well-developed and well-nourished.  Eyes: Conjunctivae and EOM are normal. Pupils are equal, round, and reactive to light.  Neck:  Neck has limited range of motion at end ranges of rotation. There is no cervical adenopathy.  Cardiovascular: Normal rate and intact distal pulses.   Pulmonary/Chest: Effort normal.  Musculoskeletal:  Cervical range of motion limited in ranges with rotation right more than left. She has pain with extension of the cervical spine. She does have trigger points noted along the levator scapula and trapezius areas and only reproduce a small bit of her pain.  She has impingement signs of the right shoulder with rotation internal and external. She can actively abduct but causes her pain. She has strength throughout the upper extremities bilaterally particularly with wrist extension long finger flexion and abduction. She has a negative Spurling's test.  Neurological: She is alert and oriented to person, place, and time.  Skin: Skin is warm and dry. No rash noted. No erythema.  Psychiatric: She has a normal mood and affect. Her behavior is normal.  Nursing note and vitals reviewed.   Ortho Exam Imaging: No results found.  Past Medical/Family/Surgical/Social History: Medications & Allergies reviewed per EMR Patient Active Problem List   Diagnosis Date Noted  . Cervicalgia 03/05/2016  . Post laminectomy syndrome 03/05/2016  . Chronic pain syndrome 03/05/2016  . Fibromyalgia 03/05/2016  . Chronic right shoulder pain 11/25/2015   Past Medical History:  Diagnosis Date  . Arthritis   . Chronic back pain   . Fibromyalgia   . Hypertension    History reviewed. No pertinent family history. Past Surgical History:  Procedure Laterality Date  . NECK SURGERY     Social History   Occupational History  . Not on file.   Social History Main Topics  . Smoking status: Never Smoker  . Smokeless  tobacco: Never Used  . Alcohol use Yes  . Drug use: Yes    Types: Marijuana  . Sexual activity: Not on file

## 2016-03-09 ENCOUNTER — Telehealth (INDEPENDENT_AMBULATORY_CARE_PROVIDER_SITE_OTHER): Payer: Self-pay | Admitting: Physical Medicine and Rehabilitation

## 2016-03-09 ENCOUNTER — Encounter (INDEPENDENT_AMBULATORY_CARE_PROVIDER_SITE_OTHER): Payer: Medicaid Other | Admitting: Physical Medicine and Rehabilitation

## 2016-03-09 DIAGNOSIS — M545 Low back pain: Principal | ICD-10-CM

## 2016-03-09 DIAGNOSIS — G8929 Other chronic pain: Secondary | ICD-10-CM

## 2016-03-09 MED ORDER — TRAMADOL HCL 50 MG PO TABS: 50.0000 mg | ORAL_TABLET | Freq: Four times a day (QID) | ORAL | 0 refills | Status: DC | PRN

## 2016-03-09 NOTE — Telephone Encounter (Signed)
Called patient to notify and faxed to her pharmacy of choice.

## 2016-03-09 NOTE — Telephone Encounter (Signed)
Patient was scheduled for an injection but our fluoroscopy unit was down. She asks for pain control to we could see her. She has had tramadol in the past so we did prescribe that. I would not prescribe anything higher at this point. She has had rales of multiple medications in the past by a combination of primary care doctors as well as emergency room doctors.

## 2016-03-09 NOTE — Telephone Encounter (Signed)
rx'd tramadol. Can take with Tylenol and Ibuprofen OTC

## 2016-03-23 ENCOUNTER — Ambulatory Visit (INDEPENDENT_AMBULATORY_CARE_PROVIDER_SITE_OTHER): Payer: Medicaid Other | Admitting: Physical Medicine and Rehabilitation

## 2016-03-23 ENCOUNTER — Encounter (INDEPENDENT_AMBULATORY_CARE_PROVIDER_SITE_OTHER): Payer: Self-pay | Admitting: Physical Medicine and Rehabilitation

## 2016-03-23 ENCOUNTER — Ambulatory Visit (INDEPENDENT_AMBULATORY_CARE_PROVIDER_SITE_OTHER): Payer: Medicaid Other

## 2016-03-23 VITALS — BP 176/102 | HR 62 | Temp 98.5°F

## 2016-03-23 DIAGNOSIS — M5412 Radiculopathy, cervical region: Secondary | ICD-10-CM | POA: Diagnosis not present

## 2016-03-23 MED ORDER — LIDOCAINE HCL (PF) 1 % IJ SOLN
0.3300 mL | Freq: Once | INTRAMUSCULAR | Status: AC
  Administered 2016-03-23: 0.3 mL

## 2016-03-23 MED ORDER — BUPIVACAINE HCL 0.25 % IJ SOLN
2.0000 mL | Freq: Once | INTRAMUSCULAR | Status: AC
  Administered 2016-03-23: 2 mL via INTRA_ARTICULAR

## 2016-03-23 NOTE — Progress Notes (Signed)
[name] [name] - 56 y.o. female MRN [id number]  Date of birth: 07/06/1960  Office Visit Note: Visit Date: 03/23/2016 PCP: [facility] Referred by: [facility]  Subjective: Chief Complaint  Patient presents with  . Neck - Pain   HPI: [name] [name] is a 56 year old female with prior ACDF at chronic pain syndrome who is here today for planned right C7-IL interlaminar injection. No change in symptoms. Please see our prior notes for further details and justification. CT myelogram reviewed below.    ROS Otherwise per HPI.  Assessment & Plan: Visit Diagnoses:  1. Cervical radiculopathy     Plan: Findings:  Right C7-T1 interlaminar epidural steroid injection.    Meds & Orders:  Meds ordered this encounter  Medications  . lidocaine (PF) (XYLOCAINE) 1 % injection 0.3 mL  . Celestone 6 mg with Marcaine 0.25% 3 mL injection    Orders Placed This Encounter  Procedures  . XR C-ARM NO REPORT  . Epidural Steroid injection    Follow-up: Return if symptoms worsen or fail to improve.   Procedures: No procedures performed  Cervical Epidural Steroid Injection - Interlaminar Approach with Fluoroscopic Guidance  Patient: [name] [name]      Date of Birth: 05-14-1960 MRN: [id number] PCP: [facility]      Visit Date: 03/23/2016   Universal Protocol:    Date/Time: 03/05/181:37 PM  Consent Given By: the patient  Position: PRONE  Additional Comments: Vital signs were monitored before and after the procedure. Patient was prepped and draped in the usual sterile fashion. The correct patient, procedure, and site was verified.   Injection Procedure Details:  Procedure Site One Meds Administered:  Meds ordered this encounter  Medications  . lidocaine (PF) (XYLOCAINE) 1 % injection 0.3 mL  . Celestone 6 mg with Marcaine 0.25% 3 mL injection     Laterality: Right  Location/Site:  C7-T1  Needle size: 20 G  Needle type: Touhy  Needle  Placement: Paramedian epidural space  Findings:  -Contrast Used: 1 mL iohexol 180 mg iodine/mL   -Comments: Excellent flow of contrast into the epidural space.  Procedure Details: Using a paramedian approach from the side mentioned above, the region overlying the inferior lamina was localized under fluoroscopic visualization and the soft tissues overlying this structure were infiltrated with 4 ml. of 1% Lidocaine without Epinephrine. A # 20 gauge, Tuohy needle was inserted into the epidural space using a paramedian approach.  The epidural space was localized using loss of resistance along with lateral and contralateral oblique bi-planar fluoroscopic views.  After negative aspirate for air, blood, and CSF, a 2 ml. volume of Isovue-250 was injected into the epidural space and the flow of contrast was observed. Radiographs were obtained for documentation purposes.   The injectate was administered into the level noted above.  Additional Comments:  The patient tolerated the procedure well Dressing: Band-Aid    Post-procedure details: Patient was observed during the procedure. Post-procedure instructions were reviewed.  Patient left the clinic in stable condition.     Clinical History: 04/12/2015 Electrodiagnostic Study EMG/NCS right upper extremity normal findings.  Cervical CT myelogram  03/04/2015  IMPRESSION: 1. 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 2. Solid-appearing ACDF C3-C7. 3. Adjacent level degenerative disc disease C7-T1 with small right parasagittal protrusion, mild anterior cord flattening, bilateral osseous foraminal encroachment right greater than left.  She reports that she has never smoked. She has never used smokeless tobacco. No results for input(s): HGBA1C, LABURIC in  the last 8760 hours.  Objective:  VS:  HT:    WT:   BMI:     BP:(!) 176/102  HR:62bpm  TEMP:98.5 F (36.9 C)(Oral)  RESP:99 % Physical Exam  Musculoskeletal:  Cervical  range of motion is limited with end ranges of rotation and extension. She has some myofascial trigger points. She has no shoulder impingement and good upper extremity strength.    Ortho Exam Imaging: Xr C-arm No Report  Result Date: 03/23/2016 Please see Notes or Procedures tab for imaging impression.   Past Medical/Family/Surgical/Social History: Medications & Allergies reviewed per EMR Patient Active Problem List   Diagnosis Date Noted  . Cervicalgia 03/05/2016  . Post laminectomy syndrome 03/05/2016  . Chronic pain syndrome 03/05/2016  . Fibromyalgia 03/05/2016  . Chronic right shoulder pain 11/25/2015   Past Medical History:  Diagnosis Date  . Arthritis   . Chronic back pain   . Fibromyalgia   . Hypertension    History reviewed. No pertinent family history. Past Surgical History:  Procedure Laterality Date  . NECK SURGERY     Social History   Occupational History  . Not on file.   Social History Main Topics  . Smoking status: Never Smoker  . Smokeless tobacco: Never Used  . Alcohol use Yes  . Drug use: Yes    Types: Marijuana  . Sexual activity: Not on file

## 2016-03-23 NOTE — Patient Instructions (Signed)

## 2016-03-23 NOTE — Procedures (Signed)
Cervical Epidural Steroid Injection - Interlaminar Approach with Fluoroscopic Guidance  Patient: [name] [name]      Date of Birth: 11/04/1960 MRN: [id number] [facility]      Visit Date: 03/23/2016   Universal Protocol:    Date/Time: 03/05/181:37 PM  Consent Given By: the patient  Position: PRONE  Additional Comments: Vital signs were monitored before and after the procedure. Patient was prepped and draped in the usual sterile fashion. The correct patient, procedure, and site was verified.   Injection Procedure Details:  Procedure Site One Meds Administered:  Meds ordered this encounter  Medications  . lidocaine (PF) (XYLOCAINE) 1 % injection 0.3 mL  . Celestone 6 mg with Marcaine 0.25% 3 mL injection     Laterality: Right  Location/Site:  C7-T1  Needle size: 20 G  Needle type: Touhy  Needle Placement: Paramedian epidural space  Findings:  -Contrast Used: 1 mL iohexol 180 mg iodine/mL   -Comments: Excellent flow of contrast into the epidural space.  Procedure Details: Using a paramedian approach from the side mentioned above, the region overlying the inferior lamina was localized under fluoroscopic visualization and the soft tissues overlying this structure were infiltrated with 4 ml. of 1% Lidocaine without Epinephrine. A # 20 gauge, Tuohy needle was inserted into the epidural space using a paramedian approach.  The epidural space was localized using loss of resistance along with lateral and contralateral oblique bi-planar fluoroscopic views.  After negative aspirate for air, blood, and CSF, a 2 ml. volume of Isovue-250 was injected into the epidural space and the flow of contrast was observed. Radiographs were obtained for documentation purposes.   The injectate was administered into the level noted above.  Additional Comments:  The patient tolerated the procedure well Dressing: Band-Aid    Post-procedure details: Patient was observed  during the procedure. Post-procedure instructions were reviewed.  Patient left the clinic in stable condition.

## 2016-04-18 IMAGING — CT CT HEAD W/O CM
2 series · 16 of 30 positions shown, 20 images · non-contrast
Comparison: 03/23/2012

CLINICAL DATA: Headache, weakness for 3 days

EXAM:
CT HEAD WITHOUT CONTRAST
TECHNIQUE: Contiguous axial images were obtained from the base of the skull
through the vertex without intravenous contrast.

[Series 201: head w/o, idose (1) · axial · non-contrast · 0.43mm/px · z∈[+84,+204]mm · 13 of 29 slices shown, 17 images]
[im 3/29  brain]
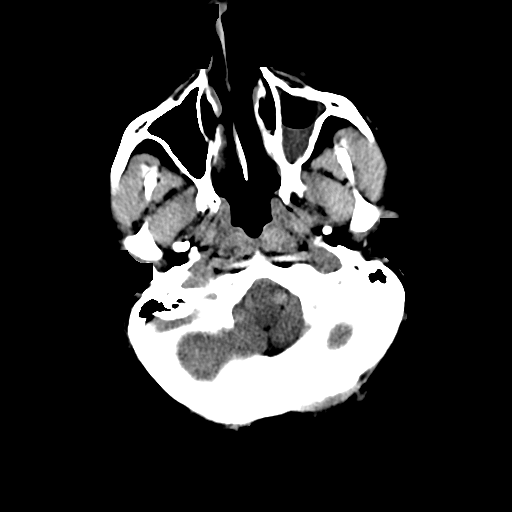
[im 3/29  bone]
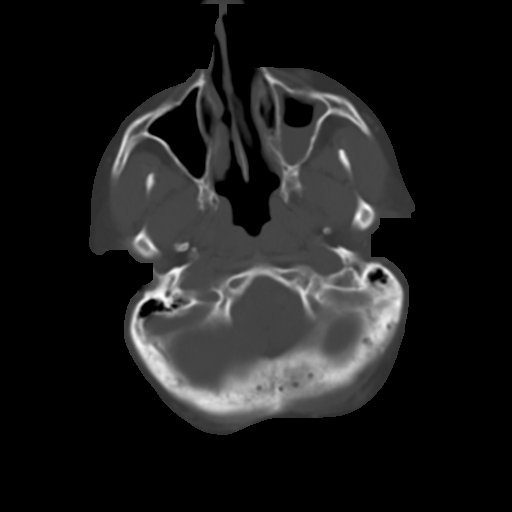
[im 5/29  brain]
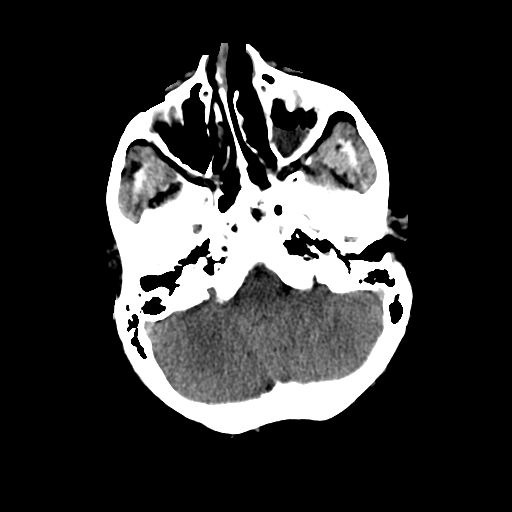
[im 7/29  brain]
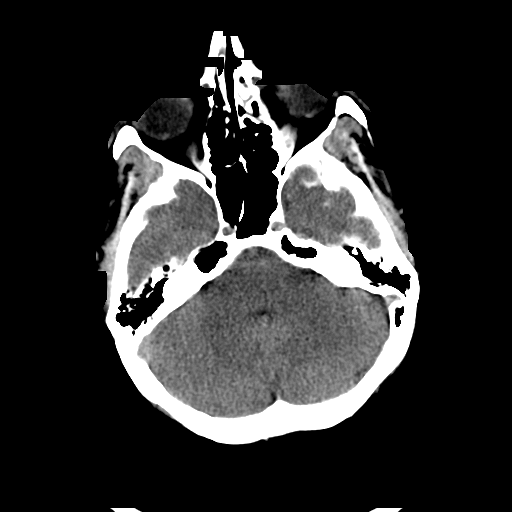
[im 9/29  brain]
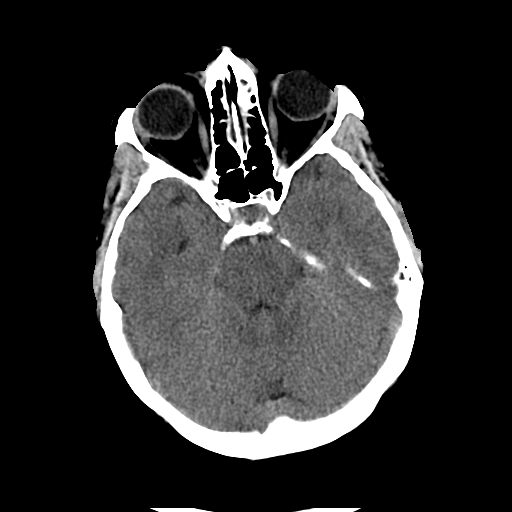
[im 11/29  brain]
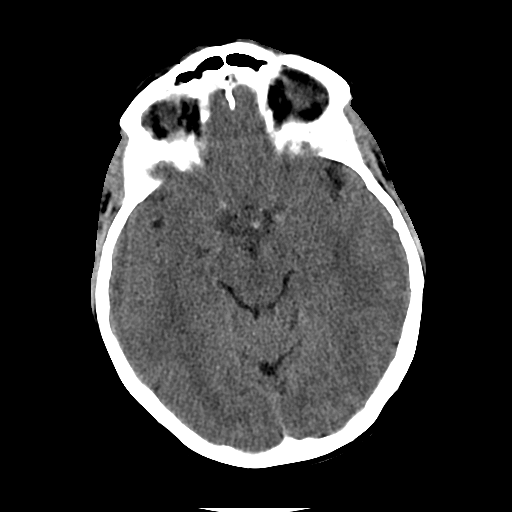
[im 11/29  bone]
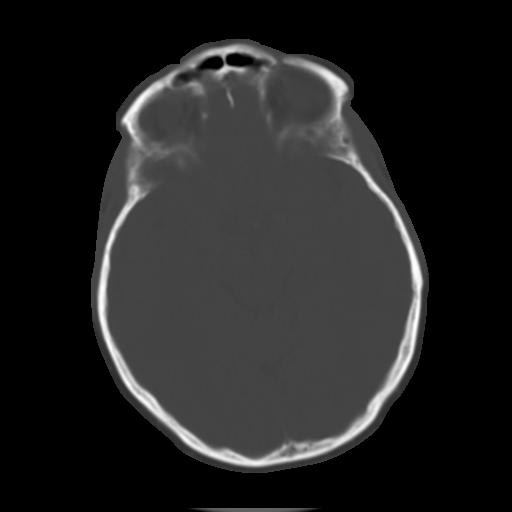
[im 13/29  brain]
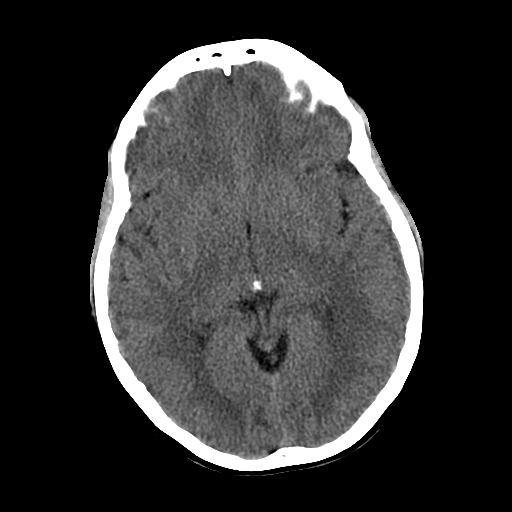
[im 15/29  brain]
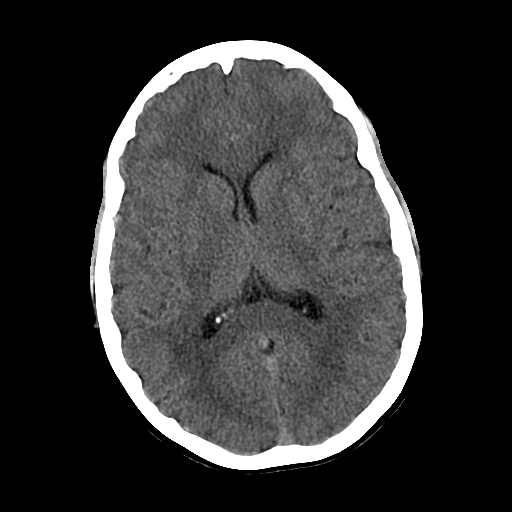
[im 17/29  brain]
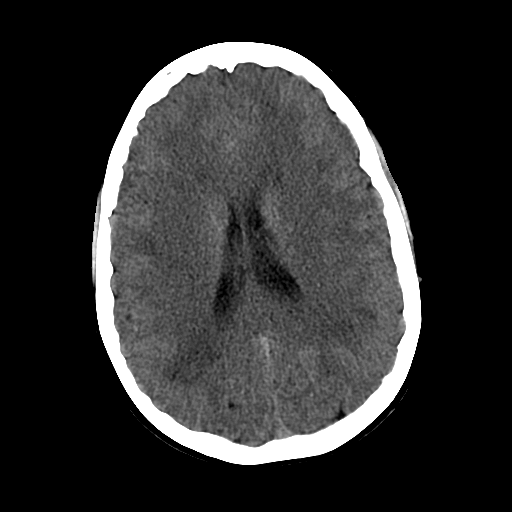
[im 19/29  brain]
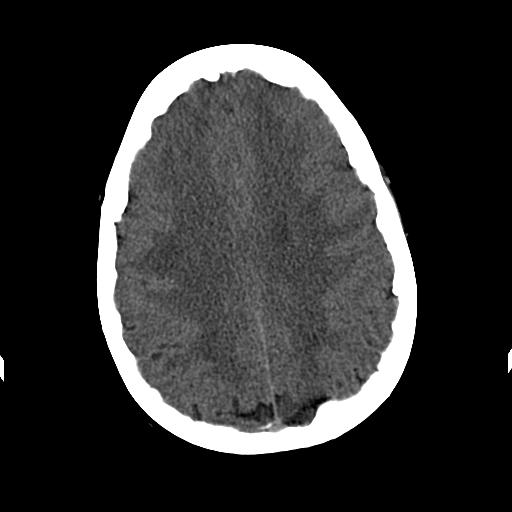
[im 19/29  bone]
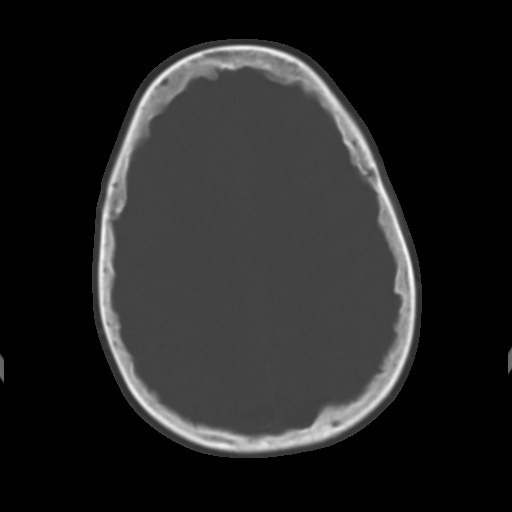
[im 21/29  brain]
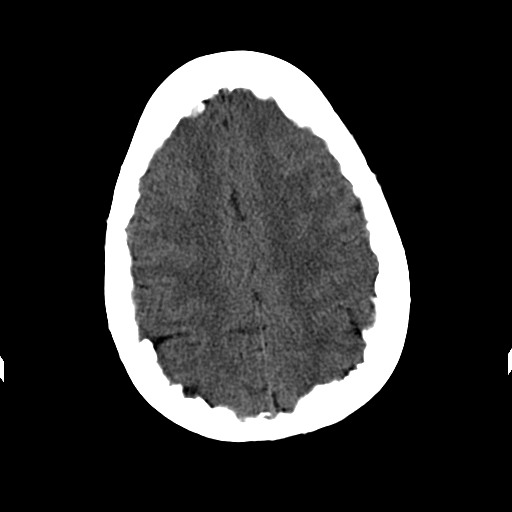
[im 23/29  brain]
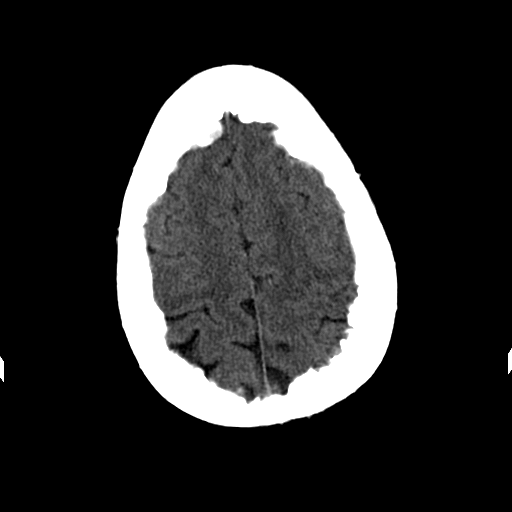
[im 25/29  brain]
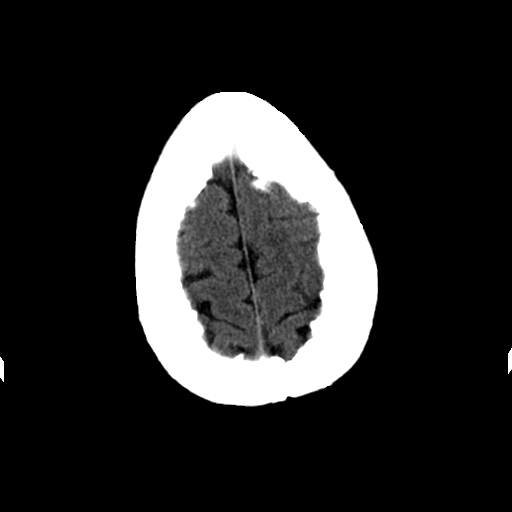
[im 27/29  brain]
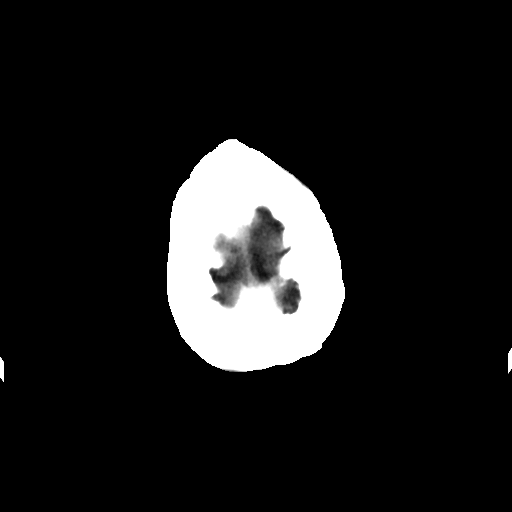
[im 27/29  bone]
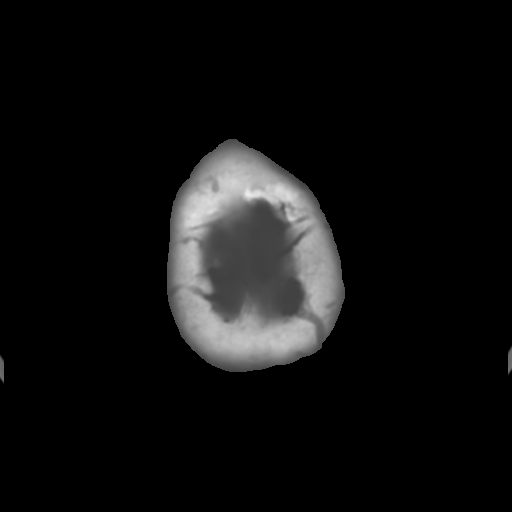

[Series 202: head w/o bone, idose (1) · axial · non-contrast · 0.43mm/px · z∈[+84,+124]mm · 3 of 29 slices shown]
[im 3/29  bone]
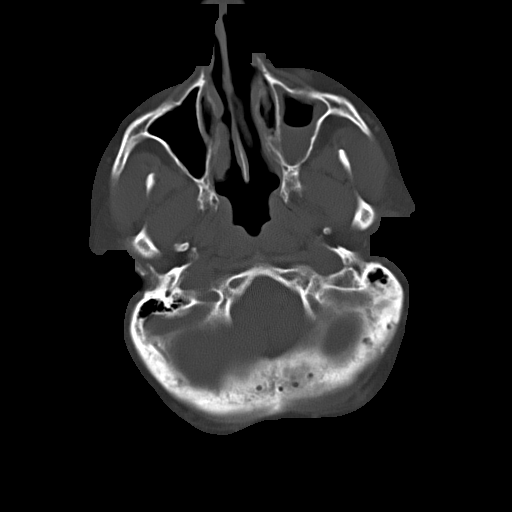
[im 7/29  bone]
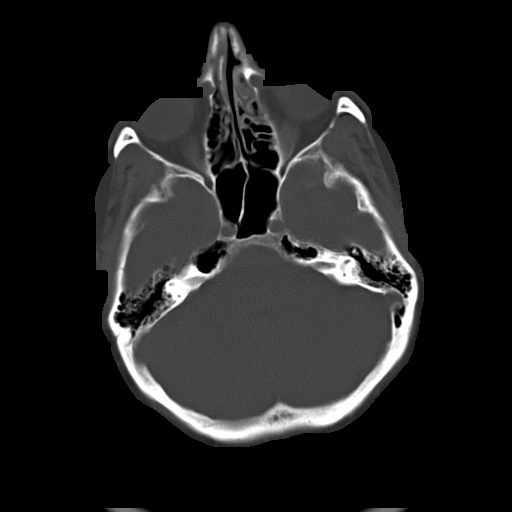
[im 11/29  bone]
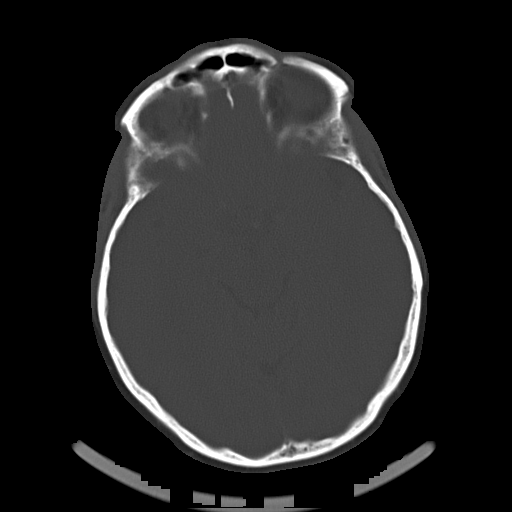

[16 of 30 positions shown; findings below may reference images not displayed]

FINDINGS: No skull fracture is noted. There is mucosal thickening with almost
complete opacification left maxillary sinus. The mastoid air cells
are unremarkable.

No intracranial hemorrhage, mass effect or midline shift. No acute
cortical infarction. No mass lesion is noted on this unenhanced
scan. Stable nonspecific patchy white matter decreased attenuation.
IMPRESSION: No acute intracranial abnormality. Stable nonspecific patchy small
areas of white matter decreased attenuation.

## 2016-04-27 ENCOUNTER — Telehealth (INDEPENDENT_AMBULATORY_CARE_PROVIDER_SITE_OTHER): Payer: Self-pay | Admitting: Physical Medicine and Rehabilitation

## 2016-04-27 NOTE — Telephone Encounter (Signed)
Called patient and she asked to be scheduled with Dr. Ophelia Charter or Dr. Otelia Sergeant. Scheduled with Dr. Ophelia Charter on 4/25.

## 2016-04-27 NOTE — Telephone Encounter (Signed)
If it is mostly shoulder pain she should follow up with Dr. Roda Shutters or Dr. August Saucer. If she is still having referral pain given her MRI findings she would need an appointment with her prior spine surgeon that did the multilevel ACDF or Dr. Gasper Sells in our office.

## 2016-05-13 ENCOUNTER — Ambulatory Visit (INDEPENDENT_AMBULATORY_CARE_PROVIDER_SITE_OTHER): Payer: Medicaid Other | Admitting: Orthopaedic Surgery

## 2016-08-28 ENCOUNTER — Encounter (HOSPITAL_COMMUNITY): Payer: Self-pay | Admitting: Emergency Medicine

## 2016-08-28 ENCOUNTER — Emergency Department (HOSPITAL_COMMUNITY)
Admission: EM | Admit: 2016-08-28 | Discharge: 2016-08-28 | Disposition: A | Discharge status: Home / Self Care | Payer: Medicaid Other | Attending: Emergency Medicine | Admitting: Emergency Medicine

## 2016-08-28 DIAGNOSIS — I10 Essential (primary) hypertension: Secondary | ICD-10-CM | POA: Diagnosis not present

## 2016-08-28 DIAGNOSIS — Y929 Unspecified place or not applicable: Secondary | ICD-10-CM | POA: Insufficient documentation

## 2016-08-28 DIAGNOSIS — S61111A Laceration without foreign body of right thumb with damage to nail, initial encounter: Secondary | ICD-10-CM | POA: Insufficient documentation

## 2016-08-28 DIAGNOSIS — S6991XA Unspecified injury of right wrist, hand and finger(s), initial encounter: Secondary | ICD-10-CM | POA: Diagnosis present

## 2016-08-28 DIAGNOSIS — Z87891 Personal history of nicotine dependence: Secondary | ICD-10-CM | POA: Diagnosis not present

## 2016-08-28 DIAGNOSIS — Z79899 Other long term (current) drug therapy: Secondary | ICD-10-CM | POA: Diagnosis not present

## 2016-08-28 DIAGNOSIS — X58XXXA Exposure to other specified factors, initial encounter: Secondary | ICD-10-CM | POA: Insufficient documentation

## 2016-08-28 DIAGNOSIS — S61309A Unspecified open wound of unspecified finger with damage to nail, initial encounter: Secondary | ICD-10-CM

## 2016-08-28 DIAGNOSIS — Y999 Unspecified external cause status: Secondary | ICD-10-CM | POA: Insufficient documentation

## 2016-08-28 DIAGNOSIS — Y9389 Activity, other specified: Secondary | ICD-10-CM | POA: Insufficient documentation

## 2016-08-28 MED ORDER — BUPIVACAINE HCL (PF) 0.5 % IJ SOLN
10.0000 mL | Freq: Once | INTRAMUSCULAR | Status: AC
  Administered 2016-08-28: 10 mL
  Filled 2016-08-28: qty 10

## 2016-08-28 NOTE — ED Triage Notes (Signed)
Pt states "her nail has been dead for 2 years" but today she was putting something in her back jean pocket and the nail got caught on the pocket. Pt reports it was the worst pain she has ever felt... No redness, swelling, bleeding noted.

## 2016-08-28 NOTE — ED Provider Notes (Signed)
[facility]-EMERGENCY DEPT Provider Note   CSN: [id number] Arrival date & time: 08/28/16  1746  By signing my name below, I, Rosario Adie, attest that this documentation has been prepared under the direction and in the presence of Marky Buresh, PA-C.  Electronically Signed: Rosario Adie, ED Scribe. 08/28/16. 8:53 PM.  History   Chief Complaint Chief Complaint  Patient presents with  . Hand Pain   The history is provided by the patient. No language interpreter was used.   HPI Comments: [name] [name] is a 56 y.o. female with a h/o HTN, fibromyalgia, chronic pain syndrome, who presents to the Emergency Department complaining of sudden onset, worsening right thumb pain associated w/ the nail beginning earlier today. Pt reports that her nail to the right first digit "has been dead for two years"; however, today her nail caught on her pants pocket causing the nail to bend backward and causing a great deal of pain. Current pain is 10/10, throbbing, nonradiating. She also notes some bleeding from the nail since. Her pain is worse with manipulation of the nail. No noted treatments were tried prior to coming into the ED. She denies numbness, weakness, swelling, or any other associated symptoms. Tetanus is UTD.   Past Medical History:  Diagnosis Date  . Arthritis   . Chronic back pain   . Fibromyalgia   . Hypertension    Patient Active Problem List   Diagnosis Date Noted  . Cervicalgia 03/05/2016  . Post laminectomy syndrome 03/05/2016  . Chronic pain syndrome 03/05/2016  . Fibromyalgia 03/05/2016  . Chronic right shoulder pain 11/25/2015   Past Surgical History:  Procedure Laterality Date  . NECK SURGERY     OB History    No data available     Home Medications    Prior to Admission medications   Medication Sig Start Date End Date Taking? Authorizing Provider  acetaminophen (TYLENOL) 500 MG tablet Take 1 tablet (500 mg total) by mouth every 6 (six) hours as needed. 10/28/15    Cheri Fowler, PA-C  cyclobenzaprine (FLEXERIL) 10 MG tablet Take 1 tablet (10 mg total) by mouth 3 (three) times daily as needed for muscle spasms. Patient not taking: Reported on 02/10/2016 10/29/14   Linwood Dibbles, MD  diazepam (VALIUM) 5 MG tablet Take 1 tablet (5 mg total) by mouth every 6 (six) hours as needed for muscle spasms. Patient not taking: Reported on 02/10/2016 02/01/15   Cheri Fowler, PA-C  hydrochlorothiazide (HYDRODIURIL) 25 MG tablet Take 1 tablet (25 mg total) by mouth daily. Patient not taking: Reported on 02/10/2016 08/17/14   Muthersbaugh, Dahlia Client, PA-C  ibuprofen (ADVIL,MOTRIN) 800 MG tablet Take 1 tablet (800 mg total) by mouth 3 (three) times daily. Patient not taking: Reported on 02/10/2016 02/01/15   Cheri Fowler, PA-C  indomethacin (INDOCIN) 25 MG capsule Take 1 capsule (25 mg total) by mouth 3 (three) times daily as needed. Patient not taking: Reported on 02/10/2016 08/05/13   Marlon Pel, PA-C  lisinopril (PRINIVIL,ZESTRIL) 20 MG tablet Take 20 mg by mouth daily.    [provider]  meclizine (ANTIVERT) 50 MG tablet Take 0.5 tablets (25 mg total) by mouth 3 (three) times daily as needed. Patient not taking: Reported on 02/10/2016 01/13/15   Barrett Henle, PA-C  naproxen (NAPROSYN) 500 MG tablet Take 1 tablet (500 mg total) by mouth 2 (two) times daily. Patient not taking: Reported on 12/16/2015 10/28/15   Cheri Fowler, PA-C  oxyCODONE-acetaminophen (PERCOCET/ROXICET) 5-325 MG tablet Take 1-2 tablets  by mouth every 4 (four) hours as needed for severe pain. Patient not taking: Reported on 12/16/2015 02/01/15   Cheri Fowlerose, Kayla, PA-C  oxymetazoline (AFRIN NASAL SPRAY) 0.05 % nasal spray Place 1 spray into both nostrils 2 (two) times daily. Patient not taking: Reported on 02/10/2016 01/13/15   Barrett HenleNadeau, Nicole Elizabeth, PA-C  pantoprazole (PROTONIX) 20 MG tablet Take 1 tablet (20 mg total) by mouth daily. 02/10/16   Mancel BaleWentz, Elliott, MD  traMADol (ULTRAM) 50 MG tablet Take  1 tablet (50 mg total) by mouth every 6 (six) hours as needed for moderate pain or severe pain. 03/09/16   Tyrell AntonioNewton, Frederic, MD   Family History No family history on file.  Social History Social History  Substance Use Topics  . Smoking status: Former Games developermoker  . Smokeless tobacco: Never Used  . Alcohol use Yes     Comment: socially   Allergies   Patient has no known allergies.  Review of Systems Review of Systems  Skin: Positive for wound.       +pain around the right first digit nail  Neurological: Negative for weakness and numbness.   Physical Exam Updated Vital Signs BP (!) 157/100 (BP Location: Left Arm)   Pulse 77   Temp 98.7 F (37.1 C) (Oral)   Resp 18   Ht 5\' 2"  (1.575 m)   Wt 154 lb (69.9 kg)   SpO2 100%   BMI 28.17 kg/m   Physical Exam  Constitutional: She appears well-developed and well-nourished. No distress.  HENT:  Head: Normocephalic and atraumatic.  Eyes: Conjunctivae are normal.  Neck: Neck supple.  Cardiovascular: Normal rate, regular rhythm and intact distal pulses.   Pulmonary/Chest: Effort normal.  Musculoskeletal:  Full flexion and extension at the IP and MP joints of the right thumb.  Neurological: She is alert.  No noted sensory deficits. Strength with flexion and extension at the right thumb 5/5.   Skin: Skin is warm and dry. Capillary refill takes less than 2 seconds. She is not diaphoretic.  Following digital block, nail and nail bed were examined. Most of the patient's nail for the most part lifts freely from the nail bed, but firm retention under the proximal fold persists. Nail bed appears to be intact without noted injury or laceration. No active hemorrhage.  Psychiatric: She has a normal mood and affect. Her behavior is normal.  Nursing note and vitals reviewed.  ED Treatments / Results  DIAGNOSTIC STUDIES: Oxygen Saturation is 100% on RA, normal by my interpretation.   COORDINATION OF CARE: 8:53 PM-Discussed next steps with pt.  Pt verbalized understanding and is agreeable with the plan.   Labs (all labs ordered are listed, but only abnormal results are displayed) Labs Reviewed - No data to display  EKG  EKG Interpretation None      Radiology No results found.  Procedures .Nerve Block Date/Time: 08/28/2016 9:23 PM Performed by: Anselm PancoastJOY, Chesney Suares C Authorized by: Harolyn RutherfordJOY, Maclain Cohron C   Consent:    Consent obtained:  Verbal   Consent given by:  Patient   Risks discussed:  Infection, intravenous injection, bleeding, allergic reaction, pain, nerve damage and unsuccessful block   Alternatives discussed:  No treatment Universal protocol:    Procedure explained and questions answered to patient or proxy's satisfaction: yes     Relevant documents present and verified: yes     Test results available and properly labeled: yes     Imaging studies available: yes     Required blood products, implants, devices, and  special equipment available: yes     Site marked: yes     Time out called: yes     Patient identity confirmed:  Verbally with patient Indications:    Indications:  Pain relief and procedural anesthesia Location:    Body area:  Lower extremity   Lower extremity nerve:  Superficial peroneal   Laterality:  Right Pre-procedure details:    Skin preparation:  2% chlorhexidine   Preparation: Patient was prepped and draped in usual sterile fashion   Skin anesthesia (see MAR for exact dosages):    Skin anesthesia method:  Local infiltration   Local anesthetic:  Bupivacaine 0.5% w/o epi Procedure details (see MAR for exact dosages):    Block needle gauge:  27 G   Anesthetic injected:  Bupivacaine 0.5% w/o epi   Steroid injected:  None   Additive injected:  None   Injection procedure:  Anatomic landmarks identified, anatomic landmarks palpated, incremental injection, introduced needle and negative aspiration for blood   Paresthesia:  None Post-procedure details:    Dressing:  None   Outcome:  Anesthesia achieved    Patient tolerance of procedure:  Tolerated well, no immediate complications .Nail Removal Date/Time: 08/28/2016 9:23 PM Performed by: Anselm Pancoast Authorized by: Anselm Pancoast   Consent:    Consent obtained:  Verbal   Consent given by:  Patient   Risks discussed:  Bleeding, incomplete removal, infection, pain and permanent nail deformity   Alternatives discussed:  No treatment Universal protocol:    Patient identity confirmed:  Verbally with patient Location:    Hand:  R thumb Pre-procedure details:    Skin preparation:  Betadine   Preparation: Patient was prepped and draped in the usual sterile fashion   Anesthesia (see MAR for exact dosages):    Anesthesia method:  Nerve block   Block needle gauge:  25 G   Block anesthetic:  Bupivacaine 0.5% w/o epi   Block technique:  Digital   Block injection procedure:  Anatomic landmarks identified, introduced needle, incremental injection, anatomic landmarks palpated and negative aspiration for blood   Block outcome:  Anesthesia achieved Nail Removal:    Nail removed:  Complete   Removed nail replaced and anchored: yes     Stented with:  Two simple interrupted 4-0 chromic gut sutures on either side of the nail Post-procedure details:    Dressing:  4x4 sterile gauze   Patient tolerance of procedure:  Tolerated well, no immediate complications   Medications Ordered in ED Medications  bupivacaine (MARCAINE) 0.5 % injection 10 mL (10 mLs Infiltration Given 08/28/16 2110)   Initial Impression / Assessment and Plan / ED Course  I have reviewed the triage vital signs and the nursing notes.  Pertinent labs & imaging results that were available during my care of the patient were reviewed by me and considered in my medical decision making (see chart for details).     Patient presents with some nail avulsion. Nail removed and reattached. PCP follow-up for wound check. The patient was given instructions for home care as well as return  precautions. Patient voices understanding of these instructions, accepts the plan, and is comfortable with discharge.  Final Clinical Impressions(s) / ED Diagnoses   Final diagnoses:  Avulsion of fingernail, initial encounter   New Prescriptions Discharge Medication List as of 08/28/2016 11:27 PM     I personally performed the services described in this documentation, which was scribed in my presence. The recorded information has been reviewed and is  accurate.    Concepcion Living 08/29/16 1610    Donnetta Hutching, MD 08/29/16 2120

## 2016-08-28 NOTE — Discharge Instructions (Signed)
You may remove the bandage after 24 hours. Clean the wound and surrounding area gently with tap water and mild soap. Rinse well and blot dry. Do not scrub the wound, as this may cause the nail to come off prematurely. You may shower, but avoid submerging the wound, such as with a bath or swimming. Clean the wound daily to prevent infection. Do not use cleaners such as hydrogen peroxide or alcohol.   The nail was replaced, but it is merely there for protection. The sutures will dissolve and the old nail will fall off. New nail should grow back in its place.  Pain: You may use Tylenol, naproxen, or ibuprofen for pain.  Return to the ED should signs of infection arise, such as spreading redness, puffiness/swelling, pus draining from the wound, severe increase in pain, fever over 100.110F, or any other major issues.

## 2016-08-28 NOTE — ED Notes (Signed)
Pt stable, ambulatory, states understanding of discharge instructions 

## 2016-12-26 ENCOUNTER — Emergency Department (HOSPITAL_COMMUNITY)
Admission: EM | Admit: 2016-12-26 | Discharge: 2016-12-26 | Disposition: A | Discharge status: Home / Self Care | Payer: Medicaid Other | Attending: Emergency Medicine | Admitting: Emergency Medicine

## 2016-12-26 ENCOUNTER — Encounter (HOSPITAL_COMMUNITY): Payer: Self-pay | Admitting: Emergency Medicine

## 2016-12-26 ENCOUNTER — Emergency Department (HOSPITAL_COMMUNITY): Payer: Medicaid Other

## 2016-12-26 DIAGNOSIS — Z79899 Other long term (current) drug therapy: Secondary | ICD-10-CM | POA: Insufficient documentation

## 2016-12-26 DIAGNOSIS — J069 Acute upper respiratory infection, unspecified: Secondary | ICD-10-CM | POA: Diagnosis not present

## 2016-12-26 DIAGNOSIS — J40 Bronchitis, not specified as acute or chronic: Secondary | ICD-10-CM

## 2016-12-26 DIAGNOSIS — I10 Essential (primary) hypertension: Secondary | ICD-10-CM | POA: Diagnosis not present

## 2016-12-26 DIAGNOSIS — Z5321 Procedure and treatment not carried out due to patient leaving prior to being seen by health care provider: Secondary | ICD-10-CM | POA: Diagnosis not present

## 2016-12-26 DIAGNOSIS — J209 Acute bronchitis, unspecified: Secondary | ICD-10-CM | POA: Insufficient documentation

## 2016-12-26 DIAGNOSIS — Z87891 Personal history of nicotine dependence: Secondary | ICD-10-CM | POA: Insufficient documentation

## 2016-12-26 DIAGNOSIS — R0981 Nasal congestion: Secondary | ICD-10-CM | POA: Diagnosis not present

## 2016-12-26 DIAGNOSIS — R05 Cough: Secondary | ICD-10-CM | POA: Insufficient documentation

## 2016-12-26 DIAGNOSIS — R197 Diarrhea, unspecified: Secondary | ICD-10-CM | POA: Insufficient documentation

## 2016-12-26 MED ORDER — BENZONATATE 100 MG PO CAPS: 100.0000 mg | ORAL_CAPSULE | Freq: Three times a day (TID) | ORAL | 0 refills | Status: DC

## 2016-12-26 MED ORDER — ALBUTEROL SULFATE (2.5 MG/3ML) 0.083% IN NEBU
5.0000 mg | INHALATION_SOLUTION | Freq: Once | RESPIRATORY_TRACT | Status: AC
  Administered 2016-12-26: 5 mg via RESPIRATORY_TRACT
  Filled 2016-12-26: qty 6

## 2016-12-26 MED ORDER — CETIRIZINE HCL 10 MG PO TABS: 10.0000 mg | ORAL_TABLET | Freq: Every day | ORAL | 0 refills | Status: DC

## 2016-12-26 MED ORDER — DEXAMETHASONE SODIUM PHOSPHATE 10 MG/ML IJ SOLN
10.0000 mg | Freq: Once | INTRAMUSCULAR | Status: AC
  Administered 2016-12-26: 10 mg via INTRAMUSCULAR
  Filled 2016-12-26: qty 1

## 2016-12-26 MED ORDER — ALBUTEROL SULFATE HFA 108 (90 BASE) MCG/ACT IN AERS: 1.0000 | INHALATION_SPRAY | Freq: Four times a day (QID) | RESPIRATORY_TRACT | 0 refills | Status: DC | PRN

## 2016-12-26 NOTE — ED Notes (Signed)
Pt verbalizes understanding of d/c paperwork, follow up instructions, and medications. Pt A/O x4, ambulatory. All belongings with patient upon departure.  

## 2016-12-26 NOTE — ED Provider Notes (Signed)
[facility]   CSN: [id number] Arrival date & time: 12/26/16  1225     History   Chief Complaint Chief Complaint  Patient presents with  . Cough  . Nasal Congestion    HPI  [name] [name] is a 56 y.o. Female with a history of hypertension, and acid reflux, presents complaining of 2 weeks of nonproductive cough, with some intermittent nasal congestion and sore throat and ear pain. Patient reports both of her grandchildren and her daughter sick with similar symptoms, and have been diagnosed with bronchitis. Patient reports last weekend she had some fevers, Tmax 102, but since then has had only low-grade fevers at most. Patient reports she's tried numerous over-the-counter agents to help with cough, including Robitussin, NyQuil and Delsym, which have provided no relief. She reports her whole upper body  And chest is sore from coughing, denies chest pressure or sharp chest pains. Patient reports she feels short of breath only when she gets into a coughing spell, no shortness of breath otherwise. Patient denies any hemoptysis. Denies any lower extremity swelling, no history of blood clots. She reports at the beginning of November she had similar symptoms was diagnosed with bronchitis and treated with steroids and symptoms completely resolved until now. Pt denies any abdominal pain, reports some occasional mild nausea, no vomiting, she has had a few episodes of non-bloody diarrhea      Past Medical History:  Diagnosis Date  . Arthritis   . Chronic back pain   . Fibromyalgia   . Hypertension     Patient Active Problem List   Diagnosis Date Noted  . Cervicalgia 03/05/2016  . Post laminectomy syndrome 03/05/2016  . Chronic pain syndrome 03/05/2016  . Fibromyalgia 03/05/2016  . Chronic right shoulder pain 11/25/2015    Past Surgical History:  Procedure Laterality Date  . NECK SURGERY      OB History    No data available        Home Medications    Prior to Admission medications   Medication Sig Start Date End Date Taking? Authorizing Provider  acetaminophen (TYLENOL) 500 MG tablet Take 1 tablet (500 mg total) by mouth every 6 (six) hours as needed. 10/28/15   Cheri Fowler, PA-C  cyclobenzaprine (FLEXERIL) 10 MG tablet Take 1 tablet (10 mg total) by mouth 3 (three) times daily as needed for muscle spasms. Patient not taking: Reported on 02/10/2016 10/29/14   Linwood Dibbles, MD  diazepam (VALIUM) 5 MG tablet Take 1 tablet (5 mg total) by mouth every 6 (six) hours as needed for muscle spasms. Patient not taking: Reported on 02/10/2016 02/01/15   Cheri Fowler, PA-C  hydrochlorothiazide (HYDRODIURIL) 25 MG tablet Take 1 tablet (25 mg total) by mouth daily. Patient not taking: Reported on 02/10/2016 08/17/14   Muthersbaugh, Dahlia Client, PA-C  ibuprofen (ADVIL,MOTRIN) 800 MG tablet Take 1 tablet (800 mg total) by mouth 3 (three) times daily. Patient not taking: Reported on 02/10/2016 02/01/15   Cheri Fowler, PA-C  indomethacin (INDOCIN) 25 MG capsule Take 1 capsule (25 mg total) by mouth 3 (three) times daily as needed. Patient not taking: Reported on 02/10/2016 08/05/13   Marlon Pel, PA-C  lisinopril (PRINIVIL,ZESTRIL) 20 MG tablet Take 20 mg by mouth daily.    [provider]  meclizine (ANTIVERT) 50 MG tablet Take 0.5 tablets (25 mg total) by mouth 3 (three) times daily as needed. Patient not taking: Reported on 02/10/2016 01/13/15   Barrett Henle, PA-C  naproxen (  NAPROSYN) 500 MG tablet Take 1 tablet (500 mg total) by mouth 2 (two) times daily. Patient not taking: Reported on 12/16/2015 10/28/15   Cheri Fowlerose, Kayla, PA-C  oxyCODONE-acetaminophen (PERCOCET/ROXICET) 5-325 MG tablet Take 1-2 tablets by mouth every 4 (four) hours as needed for severe pain. Patient not taking: Reported on 12/16/2015 02/01/15   Cheri Fowlerose, Kayla, PA-C  oxymetazoline (AFRIN NASAL SPRAY) 0.05 % nasal spray Place 1 spray into both nostrils 2 (two)  times daily. Patient not taking: Reported on 02/10/2016 01/13/15   Barrett HenleNadeau, Nicole Elizabeth, PA-C  pantoprazole (PROTONIX) 20 MG tablet Take 1 tablet (20 mg total) by mouth daily. 02/10/16   Mancel BaleWentz, Elliott, MD  traMADol (ULTRAM) 50 MG tablet Take 1 tablet (50 mg total) by mouth every 6 (six) hours as needed for moderate pain or severe pain. 03/09/16   Tyrell AntonioNewton, Frederic, MD    Family History No family history on file.  Social History Social History   Tobacco Use  . Smoking status: Former Games developermoker  . Smokeless tobacco: Never Used  Substance Use Topics  . Alcohol use: Yes    Comment: socially  . Drug use: Not on file     Allergies   Patient has no known allergies.   Review of Systems Review of Systems  Constitutional: Positive for fever. Negative for chills.  HENT: Positive for congestion, ear pain, postnasal drip, rhinorrhea, sinus pressure and sore throat. Negative for ear discharge, trouble swallowing and voice change.   Eyes: Negative for visual disturbance.  Respiratory: Positive for cough. Negative for chest tightness, shortness of breath, wheezing and stridor.   Cardiovascular: Negative for chest pain, palpitations and leg swelling.  Gastrointestinal: Positive for diarrhea and nausea. Negative for abdominal pain, constipation and vomiting.  Genitourinary: Negative for dysuria.  Musculoskeletal: Positive for myalgias. Negative for arthralgias.  Skin: Negative for rash.  Neurological: Negative for dizziness and headaches.     Physical Exam Updated Vital Signs BP 134/76 (BP Location: Right Arm)   Pulse 76   Temp 98 F (36.7 C) (Oral)   Resp 18   SpO2 100%   Physical Exam  Constitutional: She appears well-developed and well-nourished. No distress.  HENT:  Head: Normocephalic and atraumatic.  Mouth/Throat: Oropharynx is clear and moist.  TMs clear with good landmarks, moderate nasal mucosa edema with clear rhinorrhea, posterior oropharynx clear and moist, with some  erythema, no edema or exudates  Eyes: Right eye exhibits no discharge. Left eye exhibits no discharge.  Neck: Normal range of motion. Neck supple.  Cardiovascular: Normal rate, regular rhythm, normal heart sounds and intact distal pulses.  Pulmonary/Chest: Effort normal and breath sounds normal. No stridor. No respiratory distress. She has no wheezes. She has no rales. She exhibits no tenderness.  Lungs clear to auscultation bilaterally with good air movement, no wheezing rales or rhonchi, normal respiratory effort with no accessory muscle use  Abdominal: Soft. Bowel sounds are normal. She exhibits no distension. There is no tenderness.  Musculoskeletal:  No edema or calf tenderness  Neurological: She is alert. Coordination normal.  Skin: Skin is warm and dry. Capillary refill takes less than 2 seconds. She is not diaphoretic.  Psychiatric: She has a normal mood and affect. Her behavior is normal.  Nursing note and vitals reviewed.    ED Treatments / Results  Labs (all labs ordered are listed, but only abnormal results are displayed) Labs Reviewed - No data to display  EKG  EKG Interpretation None       Radiology Dg Chest  2 View  Result Date: 12/26/2016 CLINICAL DATA:  Dry cough for 1 week. EXAM: CHEST  2 VIEW COMPARISON:  None. FINDINGS: The heart size and mediastinal contours are within normal limits. Both lungs are clear. The visualized skeletal structures are unremarkable. IMPRESSION: Negative two view chest x-ray Electronically Signed   By: Marin Robertshristopher  Mattern M.D.   On: 12/26/2016 13:46    Procedures Procedures (including critical care time)  Medications Ordered in ED Medications  albuterol (PROVENTIL) (2.5 MG/3ML) 0.083% nebulizer solution 5 mg (5 mg Nebulization Given 12/26/16 1407)  dexamethasone (DECADRON) injection 10 mg (10 mg Intramuscular Given 12/26/16 1522)     Initial Impression / Assessment and Plan / ED Course  I have reviewed the triage vital signs and  the nursing notes.  Pertinent labs & imaging results that were available during my care of the patient were reviewed by me and considered in my medical decision making (see chart for details).  Pt CXR negative for acute infiltrate, lungs CTA on exam. Patients symptoms are consistent with acute bronchitis, likely viral etiology. Albuterol neb in ED helped with cough. Sterodis given in ED given duration of symptoms. Discussed that antibiotics are not indicated for viral infections. Pt will be discharged with symptomatic treatment, including inhaler and tessalon Perles, as well as zyrtec for nasal congestion.  Verbalizes understanding and is agreeable with plan. Pt is hemodynamically stable & in NAD prior to dc.   Final Clinical Impressions(s) / ED Diagnoses   Final diagnoses:  Acute bronchitis, unspecified organism  Viral upper respiratory tract infection    ED Discharge Orders        Ordered    albuterol (PROVENTIL HFA;VENTOLIN HFA) 108 (90 Base) MCG/ACT inhaler  Every 6 hours PRN     12/26/16 1454    benzonatate (TESSALON) 100 MG capsule  Every 8 hours     12/26/16 1454    cetirizine (ZYRTEC ALLERGY) 10 MG tablet  Daily     12/26/16 1454       Dartha LodgeFord, Elenore Wanninger N, [place] JerseyPA-C 12/27/16 0109    Shaune PollackIsaacs, [place], MD 12/27/16 [phone]

## 2016-12-26 NOTE — Discharge Instructions (Signed)
Your symptoms are likely due to acute bronchitis, which is most commonly viral. Continue to treat her symptoms supportively as we discussed, trying keep her throat but with liquids, or throat lozenges to help prevent coughing. Take Zyrtec to help with nasal congestion. You may use Tessalon Perles to help suppress your cough, and albuterol as needed.   Get help right away if: You cough up blood. You have chest pain. You have very bad shortness of breath. You become dehydrated. You faint (pass out) or keep feeling like you are going to pass out. You keep throwing up (vomiting). You have a very bad headache. Your fever or chills gets worse.

## 2016-12-26 NOTE — ED Triage Notes (Signed)
Patient here with complaints of dry cough, congestion x1 week. OTC medications with no relief. Reports that "everyone" in the household is also sick with the same symptoms. Fever couple of days ago. Patient states that she had to leave to take father home.

## 2016-12-26 NOTE — ED Triage Notes (Signed)
Patient here with complaints of dry cough, congestion x1 week. OTC medications with no relief. Reports that "everyone" in the household is also sick with the same symptoms. Fever couple of days ago.

## 2016-12-26 NOTE — ED Notes (Signed)
Pt sts that her father was brought in by EMS this am and is ready to leave now. Pt sts that she needs to leave and cannot stay now but will be back later. Pt left ambulatory w/o difficulty and with a steady gait. PA notified

## 2016-12-26 NOTE — ED Notes (Signed)
[facility] called, requested to give pt breathing tx

## 2017-03-02 ENCOUNTER — Emergency Department (HOSPITAL_COMMUNITY)
Admission: EM | Admit: 2017-03-02 | Discharge: 2017-03-02 | Disposition: A | Discharge status: Home / Self Care | Payer: Medicaid Other | Attending: Emergency Medicine | Admitting: Emergency Medicine

## 2017-03-02 ENCOUNTER — Encounter (HOSPITAL_COMMUNITY): Payer: Self-pay

## 2017-03-02 DIAGNOSIS — M797 Fibromyalgia: Secondary | ICD-10-CM | POA: Insufficient documentation

## 2017-03-02 DIAGNOSIS — L299 Pruritus, unspecified: Secondary | ICD-10-CM | POA: Diagnosis not present

## 2017-03-02 DIAGNOSIS — Z87891 Personal history of nicotine dependence: Secondary | ICD-10-CM | POA: Diagnosis not present

## 2017-03-02 DIAGNOSIS — R21 Rash and other nonspecific skin eruption: Secondary | ICD-10-CM | POA: Insufficient documentation

## 2017-03-02 DIAGNOSIS — I10 Essential (primary) hypertension: Secondary | ICD-10-CM | POA: Diagnosis not present

## 2017-03-02 MED ORDER — HYDROXYZINE HCL 25 MG PO TABS: 12.5000 mg | ORAL_TABLET | Freq: Three times a day (TID) | ORAL | 0 refills | Status: AC | PRN

## 2017-03-02 MED ORDER — DIPHENHYDRAMINE HCL 25 MG PO CAPS
25.0000 mg | ORAL_CAPSULE | Freq: Once | ORAL | Status: AC
  Administered 2017-03-02: 25 mg via ORAL
  Filled 2017-03-02: qty 1

## 2017-03-02 MED ORDER — FAMOTIDINE 40 MG PO TABS: 40.0000 mg | ORAL_TABLET | Freq: Every day | ORAL | 0 refills | Status: DC

## 2017-03-02 MED ORDER — PREDNISONE 20 MG PO TABS
60.0000 mg | ORAL_TABLET | Freq: Once | ORAL | Status: AC
  Administered 2017-03-02: 60 mg via ORAL
  Filled 2017-03-02: qty 3

## 2017-03-02 MED ORDER — PREDNISONE 20 MG PO TABS: ORAL_TABLET | ORAL | 0 refills | Status: DC

## 2017-03-02 MED ORDER — FAMOTIDINE 20 MG PO TABS
40.0000 mg | ORAL_TABLET | Freq: Once | ORAL | Status: AC
  Administered 2017-03-02: 40 mg via ORAL
  Filled 2017-03-02: qty 2

## 2017-03-02 NOTE — ED Triage Notes (Signed)
Pt complains of a generalized rash that started this am, no change in detergents, soaps, or medications Pt took benedryl about three hours ago with no relief

## 2017-03-02 NOTE — ED Provider Notes (Signed)
[facility] Provider Note   CSN: [id number] Arrival date & time: 03/02/17  1905     History   Chief Complaint Chief Complaint  Patient presents with  . Rash    HPI [name] [name] is a 57 y.o. female with a history of fibromyalgia, hypertension who presents the emergency department today for rash.  Patient notes that this morning after getting dressed she noticed development of pruritic, red rash on her back and upper chest.  She notes that since onset the area has spread diffusely now including her abdomen and extremities.  It spares the palms and soles & does not include the webspaces.  She has taken 25 mg of Benadryl at approximately 4 PM today without any relief. Nothing makes the symptoms better or worse. Denies fever, chills, contacts with persons with similar rash, or any changes in lotions/soaps/detergents. No exposure to animal or plant irritants. No recent tick bites. No recent outdoor activities that would place the patient at risk for tick bites. Denies swelling or purulent discharge. No new medications. No recent travel.  She denies any eye pain, lip swelling, lesions on tongue or mouth, dysuria or emesis.  Notes chronic diarrhea from IBS but no change since symptom onset.  Patient does not have history of immunocompromise.   HPI  Past Medical History:  Diagnosis Date  . Arthritis   . Chronic back pain   . Fibromyalgia   . Hypertension     Patient Active Problem List   Diagnosis Date Noted  . Cervicalgia 03/05/2016  . Post laminectomy syndrome 03/05/2016  . Chronic pain syndrome 03/05/2016  . Fibromyalgia 03/05/2016  . Chronic right shoulder pain 11/25/2015    Past Surgical History:  Procedure Laterality Date  . NECK SURGERY      OB History    No data available       Home Medications    Prior to Admission medications   Medication Sig Start Date End Date Taking? Authorizing Provider  acetaminophen (TYLENOL) 500 MG  tablet Take 1 tablet (500 mg total) by mouth every 6 (six) hours as needed. 10/28/15   Cheri Fowler, PA-C  albuterol (PROVENTIL HFA;VENTOLIN HFA) 108 (90 Base) MCG/ACT inhaler Inhale 1-2 puffs into the lungs every 6 (six) hours as needed for wheezing or shortness of breath. 12/26/16   Dartha Lodge, PA-C  benzonatate (TESSALON) 100 MG capsule Take 1 capsule (100 mg total) by mouth every 8 (eight) hours. 12/26/16   Dartha Lodge, PA-C  cetirizine (ZYRTEC ALLERGY) 10 MG tablet Take 1 tablet (10 mg total) by mouth daily. 12/26/16   Dartha Lodge, PA-C  cyclobenzaprine (FLEXERIL) 10 MG tablet Take 1 tablet (10 mg total) by mouth 3 (three) times daily as needed for muscle spasms. Patient not taking: Reported on 02/10/2016 10/29/14   Linwood Dibbles, MD  diazepam (VALIUM) 5 MG tablet Take 1 tablet (5 mg total) by mouth every 6 (six) hours as needed for muscle spasms. Patient not taking: Reported on 02/10/2016 02/01/15   Cheri Fowler, PA-C  hydrochlorothiazide (HYDRODIURIL) 25 MG tablet Take 1 tablet (25 mg total) by mouth daily. Patient not taking: Reported on 02/10/2016 08/17/14   Muthersbaugh, Dahlia Client, PA-C  ibuprofen (ADVIL,MOTRIN) 800 MG tablet Take 1 tablet (800 mg total) by mouth 3 (three) times daily. Patient not taking: Reported on 02/10/2016 02/01/15   Cheri Fowler, PA-C  indomethacin (INDOCIN) 25 MG capsule Take 1 capsule (25 mg total) by mouth 3 (three) times daily as needed.  Patient not taking: Reported on 02/10/2016 08/05/13   Marlon PelGreene, Tiffany, PA-C  lisinopril (PRINIVIL,ZESTRIL) 20 MG tablet Take 20 mg by mouth daily.    [provider]  meclizine (ANTIVERT) 50 MG tablet Take 0.5 tablets (25 mg total) by mouth 3 (three) times daily as needed. Patient not taking: Reported on 02/10/2016 01/13/15   Barrett HenleNadeau, Nicole Elizabeth, PA-C  naproxen (NAPROSYN) 500 MG tablet Take 1 tablet (500 mg total) by mouth 2 (two) times daily. Patient not taking: Reported on 12/16/2015 10/28/15   Cheri Fowlerose, Kayla, PA-C    oxyCODONE-acetaminophen (PERCOCET/ROXICET) 5-325 MG tablet Take 1-2 tablets by mouth every 4 (four) hours as needed for severe pain. Patient not taking: Reported on 12/16/2015 02/01/15   Cheri Fowlerose, Kayla, PA-C  oxymetazoline (AFRIN NASAL SPRAY) 0.05 % nasal spray Place 1 spray into both nostrils 2 (two) times daily. Patient not taking: Reported on 02/10/2016 01/13/15   Barrett HenleNadeau, Nicole Elizabeth, PA-C  pantoprazole (PROTONIX) 20 MG tablet Take 1 tablet (20 mg total) by mouth daily. 02/10/16   Mancel BaleWentz, Elliott, MD  traMADol (ULTRAM) 50 MG tablet Take 1 tablet (50 mg total) by mouth every 6 (six) hours as needed for moderate pain or severe pain. 03/09/16   Tyrell AntonioNewton, Frederic, MD    Family History History reviewed. No pertinent family history.  Social History Social History   Tobacco Use  . Smoking status: Former Games developermoker  . Smokeless tobacco: Never Used  Substance Use Topics  . Alcohol use: Yes    Comment: socially  . Drug use: No     Allergies   Patient has no known allergies.   Review of Systems Review of Systems  All other systems reviewed and are negative.    Physical Exam Updated Vital Signs BP (!) 167/89 (BP Location: Left Arm)   Pulse 83   Temp 98.3 F (36.8 C) (Oral)   Resp 18   SpO2 99%   Physical Exam  Constitutional: She appears well-developed and well-nourished.  HENT:  Head: Normocephalic and atraumatic.  Right Ear: External ear normal.  Left Ear: External ear normal.  No angioedema or uvular swelling.  No blisters noted at the oropharynx or gingival mucosa.  No sloughing of lips.  Eyes: Conjunctivae are normal. Right eye exhibits no discharge. Left eye exhibits no discharge. No scleral icterus.  Cardiovascular: Normal rate and regular rhythm.  Pulmonary/Chest: Effort normal and breath sounds normal. No stridor. No respiratory distress. She has no wheezes.  Abdominal: Soft. Bowel sounds are normal. She exhibits no distension. There is no tenderness.  Neurological:  She is alert.  Skin: Skin is warm and dry. Capillary refill takes less than 2 seconds. Rash noted. Rash is maculopapular. No pallor.  Diffuse blanhable maculopapular rash greatest on chest and back but also involving all 4 extremities, abdomen. There is some evidence of excoriations from patient scratching. No blisters, no pustules, no warmth, no draining sinus tracts, no superficial abscesses, no bullous impetigo, no vesicles, no desquamation, no target lesions with dusky purpura or a central bulla. Not tender to touch. No involvement of palms or soles. No burrows between webspaces.  Negative Nikolsky sign.   Psychiatric: She has a normal mood and affect.  Nursing note and vitals reviewed.      ED Treatments / Results  Labs (all labs ordered are listed, but only abnormal results are displayed) Labs Reviewed - No data to display  EKG  EKG Interpretation None       Radiology No results found.  Procedures Procedures (including  critical care time)  Medications Ordered in ED Medications  famotidine (PEPCID) tablet 40 mg (40 mg Oral Given 03/02/17 2155)  diphenhydrAMINE (BENADRYL) capsule 25 mg (25 mg Oral Given 03/02/17 2155)  predniSONE (DELTASONE) tablet 60 mg (60 mg Oral Given 03/02/17 2154)     Initial Impression / Assessment and Plan / ED Course  I have reviewed the triage vital signs and the nursing notes.  Pertinent labs & imaging results that were available during my care of the patient were reviewed by me and considered in my medical decision making (see chart for details).      Patient with diffuse pruritic rash sparing face, palms and soles. Patient denies any difficulty breathing or swallowing.  Pt has a patent airway without stridor and is handling secretions without difficulty; no angioedema. No blisters, no pustules, no warmth, no draining sinus tracts, no superficial abscesses, no bullous impetigo, no vesicles, no desquamation, no target lesions with dusky purpura  or a central bulla. Not tender to touch. No concern for superimposed infection. No concern for SJS, TEN, TSS, tick borne illness, syphilis or other life-threatening condition. Patient does not she has had several sick contacts. Possible related to viral illness. Will discharge home with short course of steroids, pepcid and Atarax. She was given same in department with significant relief of her symptoms. I advised the patient to follow-up with PCP this week. Specific return precautions discussed. Time was given for all questions to be answered. The patient verbalized understanding and agreement with plan. The patient appears safe for discharge home. Patient case seen and discussed with Dr. Erma Heritage who is in agreement with plan.   Final Clinical Impressions(s) / ED Diagnoses   Final diagnoses:  Rash and nonspecific skin eruption    ED Discharge Orders        Ordered    hydrOXYzine (ATARAX/VISTARIL) 25 MG tablet  Every 8 hours PRN     03/02/17 2234    predniSONE (DELTASONE) 20 MG tablet     03/02/17 2234    famotidine (PEPCID) 40 MG tablet  Daily     03/02/17 2234       Princella Pellegrini 03/02/17 2236    Shaune Pollack, MD 03/03/17 [phone]

## 2017-03-02 NOTE — Discharge Instructions (Signed)
You were seen here today for rash.  Your rash appears to be related to a viral illness.  Please take medications as prescribed.  Please follow-up with your primary care physician in the next 48-72 hours.  If you start developing fever, eye pain, lip swelling, shortness of breath, pain with urination, vomiting or any other new concerning symptoms please return to the emergency department.

## 2017-05-14 ENCOUNTER — Emergency Department (HOSPITAL_COMMUNITY)
Admission: EM | Admit: 2017-05-14 | Discharge: 2017-05-14 | Disposition: A | Discharge status: Home / Self Care | Payer: Medicaid Other | Attending: Emergency Medicine | Admitting: Emergency Medicine

## 2017-05-14 ENCOUNTER — Encounter (HOSPITAL_COMMUNITY): Payer: Self-pay | Admitting: *Deleted

## 2017-05-14 DIAGNOSIS — Z87891 Personal history of nicotine dependence: Secondary | ICD-10-CM | POA: Insufficient documentation

## 2017-05-14 DIAGNOSIS — M62838 Other muscle spasm: Secondary | ICD-10-CM

## 2017-05-14 DIAGNOSIS — R51 Headache: Secondary | ICD-10-CM | POA: Diagnosis not present

## 2017-05-14 DIAGNOSIS — M542 Cervicalgia: Secondary | ICD-10-CM | POA: Diagnosis present

## 2017-05-14 DIAGNOSIS — R200 Anesthesia of skin: Secondary | ICD-10-CM | POA: Diagnosis not present

## 2017-05-14 DIAGNOSIS — Z79899 Other long term (current) drug therapy: Secondary | ICD-10-CM | POA: Insufficient documentation

## 2017-05-14 MED ORDER — HYDROCODONE-ACETAMINOPHEN 5-325 MG PO TABS: 1.0000 | ORAL_TABLET | Freq: Four times a day (QID) | ORAL | 0 refills | Status: AC | PRN

## 2017-05-14 MED ORDER — KETOROLAC TROMETHAMINE 60 MG/2ML IM SOLN
60.0000 mg | Freq: Once | INTRAMUSCULAR | Status: AC
  Administered 2017-05-14: 60 mg via INTRAMUSCULAR
  Filled 2017-05-14: qty 2

## 2017-05-14 MED ORDER — DIAZEPAM 5 MG PO TABS
5.0000 mg | ORAL_TABLET | Freq: Once | ORAL | Status: AC
  Administered 2017-05-14: 5 mg via ORAL
  Filled 2017-05-14: qty 1

## 2017-05-14 MED ORDER — HYDROCODONE-ACETAMINOPHEN 5-325 MG PO TABS
1.0000 | ORAL_TABLET | Freq: Once | ORAL | Status: AC
  Administered 2017-05-14: 1 via ORAL
  Filled 2017-05-14: qty 1

## 2017-05-14 MED ORDER — IBUPROFEN 600 MG PO TABS: 600.0000 mg | ORAL_TABLET | Freq: Three times a day (TID) | ORAL | 0 refills | Status: DC | PRN

## 2017-05-14 MED ORDER — DIAZEPAM 5 MG PO TABS: 5.0000 mg | ORAL_TABLET | Freq: Four times a day (QID) | ORAL | 0 refills | Status: DC | PRN

## 2017-05-14 NOTE — ED Provider Notes (Signed)
[facility]-EMERGENCY DEPT Provider Note   CSN: [id number] Arrival date & time: 05/14/17  4098     History   Chief Complaint Chief Complaint  Patient presents with  . Neck Pain    HPI [name] [name] is a 57 y.o. female.  HPI Patient presents with left posterior neck pain radiating to her shoulder and posterior scalp.  Pain is chronic but gradually worsening over the last few months.  She has history of previous anterior cervical fixation back in 2008.  Had a neurosurgery consult in February of this year and had MRI in March with moderate C3/C4 left neuroforaminal stenosis, and C7-T1 bilateral neuroforaminal stenosis.  She states that she woke this morning at 4 AM and that her neck pain was worse.  She was having more difficulty turning her head.  No fever or chills.  States she took Tylenol this morning but no other medication.  No focal weakness that she has intermittent numbness to her bilateral upper extremities. Past Medical History:  Diagnosis Date  . Arthritis   . Chronic back pain   . Fibromyalgia   . Hypertension     Patient Active Problem List   Diagnosis Date Noted  . Cervicalgia 03/05/2016  . Post laminectomy syndrome 03/05/2016  . Chronic pain syndrome 03/05/2016  . Fibromyalgia 03/05/2016  . Chronic right shoulder pain 11/25/2015    Past Surgical History:  Procedure Laterality Date  . NECK SURGERY       OB History   None      Home Medications    Prior to Admission medications   Medication Sig Start Date End Date Taking? Authorizing Provider  acetaminophen (TYLENOL) 500 MG tablet Take 1 tablet (500 mg total) by mouth every 6 (six) hours as needed. Patient taking differently: Take 1,500 mg by mouth every 6 (six) hours as needed (back spasms).  10/28/15  Yes Cheri Fowler, PA-C  albuterol (PROVENTIL HFA;VENTOLIN HFA) 108 (90 Base) MCG/ACT inhaler Inhale 1-2 puffs into the lungs every 6 (six) hours as needed for wheezing or shortness  of breath. 12/26/16  Yes Dartha Lodge, PA-C  amLODipine (NORVASC) 5 MG tablet Take 5 mg by mouth daily. 04/22/17  Yes [provider]  atorvastatin (LIPITOR) 10 MG tablet Take 10 mg by mouth daily. 04/22/17  Yes [provider]  escitalopram (LEXAPRO) 10 MG tablet Take 10 mg by mouth daily. 04/22/17  Yes [provider]  gabapentin (NEURONTIN) 300 MG capsule Take 300 mg by mouth 4 (four) times daily. 04/22/17  Yes [provider]  hydrochlorothiazide (HYDRODIURIL) 25 MG tablet Take 1 tablet (25 mg total) by mouth daily. 08/17/14  Yes Muthersbaugh, Dahlia Client, PA-C  pantoprazole (PROTONIX) 20 MG tablet Take 1 tablet (20 mg total) by mouth daily. 02/10/16  Yes Mancel Bale, MD  benzonatate (TESSALON) 100 MG capsule Take 1 capsule (100 mg total) by mouth every 8 (eight) hours. Patient not taking: Reported on 05/14/2017 12/26/16   Dartha Lodge, PA-C  cetirizine (ZYRTEC ALLERGY) 10 MG tablet Take 1 tablet (10 mg total) by mouth daily. Patient not taking: Reported on 05/14/2017 12/26/16   Dartha Lodge, PA-C  cyclobenzaprine (FLEXERIL) 10 MG tablet Take 1 tablet (10 mg total) by mouth 3 (three) times daily as needed for muscle spasms. Patient not taking: Reported on 02/10/2016 10/29/14   Linwood Dibbles, MD  diazepam (VALIUM) 5 MG tablet Take 1 tablet (5 mg total) by mouth every 6 (six) hours as needed for muscle spasms. 05/14/17  Loren RacerYelverton, Kathlynn Swofford, MD  famotidine (PEPCID) 40 MG tablet Take 1 tablet (40 mg total) by mouth daily. Patient not taking: Reported on 05/14/2017 03/02/17   Maczis, Elmer SowMichael M, PA-C  HYDROcodone-acetaminophen (NORCO) 5-325 MG tablet Take 1 tablet by mouth every 6 (six) hours as needed for severe pain. 05/14/17   Loren RacerYelverton, Vanity Larsson, MD  hydrOXYzine (ATARAX/VISTARIL) 25 MG tablet Take 0.5-1 tablets (12.5-25 mg total) by mouth every 8 (eight) hours as needed for itching. Patient not taking: Reported on 05/14/2017 03/02/17   Maczis, Elmer SowMichael M, PA-C  ibuprofen (ADVIL,MOTRIN)  600 MG tablet Take 1 tablet (600 mg total) by mouth 3 (three) times daily with meals as needed for moderate pain. 05/14/17   Loren RacerYelverton, Yigit Norkus, MD  indomethacin (INDOCIN) 25 MG capsule Take 1 capsule (25 mg total) by mouth 3 (three) times daily as needed. Patient not taking: Reported on 02/10/2016 08/05/13   Marlon PelGreene, Tiffany, PA-C  meclizine (ANTIVERT) 50 MG tablet Take 0.5 tablets (25 mg total) by mouth 3 (three) times daily as needed. Patient not taking: Reported on 02/10/2016 01/13/15   Barrett HenleNadeau, Nicole Elizabeth, PA-C  naproxen (NAPROSYN) 500 MG tablet Take 1 tablet (500 mg total) by mouth 2 (two) times daily. Patient not taking: Reported on 12/16/2015 10/28/15   Cheri Fowlerose, Kayla, PA-C  oxyCODONE-acetaminophen (PERCOCET/ROXICET) 5-325 MG tablet Take 1-2 tablets by mouth every 4 (four) hours as needed for severe pain. Patient not taking: Reported on 12/16/2015 02/01/15   Cheri Fowlerose, Kayla, PA-C  oxymetazoline (AFRIN NASAL SPRAY) 0.05 % nasal spray Place 1 spray into both nostrils 2 (two) times daily. Patient not taking: Reported on 02/10/2016 01/13/15   Barrett HenleNadeau, Nicole Elizabeth, PA-C  predniSONE (DELTASONE) 20 MG tablet Take three 20 mg tablets once a day for 2 days, then two tablets once a day for 2 days, and then one tablet once a day for 2 days Patient not taking: Reported on 05/14/2017 03/02/17   Maczis, Elmer SowMichael M, PA-C  traMADol (ULTRAM) 50 MG tablet Take 1 tablet (50 mg total) by mouth every 6 (six) hours as needed for moderate pain or severe pain. Patient not taking: Reported on 05/14/2017 03/09/16   Tyrell AntonioNewton, Frederic, MD    Family History No family history on file.  Social History Social History   Tobacco Use  . Smoking status: Former Games developermoker  . Smokeless tobacco: Never Used  Substance Use Topics  . Alcohol use: Yes    Comment: socially  . Drug use: No     Allergies   Patient has no known allergies.   Review of Systems Review of Systems  Constitutional: Negative for chills and fever.  HENT:  Negative for sinus pain, sore throat and trouble swallowing.   Eyes: Negative for visual disturbance.  Respiratory: Negative for cough and shortness of breath.   Cardiovascular: Negative for chest pain.  Gastrointestinal: Negative for nausea and vomiting.  Musculoskeletal: Positive for arthralgias, back pain, myalgias, neck pain and neck stiffness. Negative for gait problem.  Skin: Negative for rash and wound.  Neurological: Positive for numbness and headaches. Negative for speech difficulty and weakness.  Psychiatric/Behavioral: The patient is nervous/anxious.      Physical Exam Updated Vital Signs BP (!) 164/93 (BP Location: Left Arm)   Pulse 73   Temp 98.6 F (37 C) (Oral)   Resp 18   Ht 5\' 1"  (1.549 m)   Wt 77.1 kg (170 lb)   SpO2 95%   BMI 32.12 kg/m   Physical Exam  Constitutional: She is oriented to person, place,  and time. She appears well-developed and well-nourished. She appears distressed.  HENT:  Head: Normocephalic and atraumatic.  Mouth/Throat: Oropharynx is clear and moist.  Eyes: Pupils are equal, round, and reactive to light. EOM are normal.  Neck:  Limited range of motion both rotational and flexion extension.  Patient has no midline cervical tenderness to palpation.  Muscle spasm and tenderness to palpation over the left paraspinal musculature, sternocleidomastoid, and trapezius.  No lymphadenopathy.   Cardiovascular: Normal rate and regular rhythm.  Pulmonary/Chest: Effort normal and breath sounds normal.  Abdominal: Soft. Bowel sounds are normal. There is no tenderness. There is no rebound and no guarding.  Musculoskeletal: Normal range of motion. She exhibits tenderness. She exhibits no edema.  Patient has mild tenderness to palpation along the medial border of the left scapula.  No midline thoracic or lumbar tenderness.  Distal pulses are 2+ in all extremities.  No lower extremity swelling, asymmetry or tenderness.  Neurological: She is alert and oriented  to person, place, and time.  5/5 motor in bilateral upper extremity abduction, 5/5 bilateral upper extremity grip strength, 5/5 motor bilateral upper extremity flexion of the elbows.  5/5 motor bilateral lower extremities.  Sensation to light touch intact.  Skin: Skin is warm and dry. No rash noted. No erythema.  Psychiatric: Her behavior is normal.  Anxious appearing and uncomfortable  Nursing note and vitals reviewed.    ED Treatments / Results  Labs (all labs ordered are listed, but only abnormal results are displayed) Labs Reviewed - No data to display  EKG None  Radiology No results found.  Procedures Procedures (including critical care time)  Medications Ordered in ED Medications  ketorolac (TORADOL) injection 60 mg (60 mg Intramuscular Given 05/14/17 1212)  diazepam (VALIUM) tablet 5 mg (5 mg Oral Given 05/14/17 1212)  HYDROcodone-acetaminophen (NORCO/VICODIN) 5-325 MG per tablet 1 tablet (1 tablet Oral Given 05/14/17 1212)     Initial Impression / Assessment and Plan / ED Course  I have reviewed the triage vital signs and the nursing notes.  Pertinent labs & imaging results that were available during my care of the patient were reviewed by me and considered in my medical decision making (see chart for details).     Patient with foraminal stenosis of C3/C4 on recent MRI as well as C7-T1.  Symptoms consistent with muscle spasms in this distribution.  Do not believe that emergent imaging is necessary at this point.  Does not appear to have any focal neurologic deficits.  Will treat symptomatically.  Patient will need to follow-up with her neurosurgeon regarding treatment for her cervical spine disease.  Patient states she is feeling much better.  Appears much more comfortable.  Advised to follow-up closely with her neurosurgeon.  Return precautions given. Final Clinical Impressions(s) / ED Diagnoses   Final diagnoses:  Muscle spasms of neck    ED Discharge Orders         Ordered    diazepam (VALIUM) 5 MG tablet  Every 6 hours PRN     05/14/17 1346    HYDROcodone-acetaminophen (NORCO) 5-325 MG tablet  Every 6 hours PRN     05/14/17 1346    ibuprofen (ADVIL,MOTRIN) 600 MG tablet  3 times daily with meals PRN     05/14/17 1346       Loren Racer, MD 05/14/17 1346

## 2017-05-14 NOTE — ED Triage Notes (Signed)
Pt complains of neck pain since this morning, back spasms x 4 days. Pt cannot turn head w/o pain. Pt states she had headache with the neck pain this morning.

## 2017-11-13 ENCOUNTER — Emergency Department (HOSPITAL_COMMUNITY): Payer: Medicaid Other

## 2017-11-13 ENCOUNTER — Encounter (HOSPITAL_COMMUNITY): Payer: Self-pay | Admitting: *Deleted

## 2017-11-13 ENCOUNTER — Other Ambulatory Visit: Payer: Self-pay

## 2017-11-13 DIAGNOSIS — Y92481 Parking lot as the place of occurrence of the external cause: Secondary | ICD-10-CM | POA: Insufficient documentation

## 2017-11-13 DIAGNOSIS — M25511 Pain in right shoulder: Secondary | ICD-10-CM | POA: Insufficient documentation

## 2017-11-13 DIAGNOSIS — Y999 Unspecified external cause status: Secondary | ICD-10-CM | POA: Insufficient documentation

## 2017-11-13 DIAGNOSIS — Z79899 Other long term (current) drug therapy: Secondary | ICD-10-CM | POA: Insufficient documentation

## 2017-11-13 DIAGNOSIS — Y9301 Activity, walking, marching and hiking: Secondary | ICD-10-CM | POA: Insufficient documentation

## 2017-11-13 DIAGNOSIS — W0110XA Fall on same level from slipping, tripping and stumbling with subsequent striking against unspecified object, initial encounter: Secondary | ICD-10-CM | POA: Insufficient documentation

## 2017-11-13 DIAGNOSIS — M25561 Pain in right knee: Secondary | ICD-10-CM | POA: Insufficient documentation

## 2017-11-13 DIAGNOSIS — I10 Essential (primary) hypertension: Secondary | ICD-10-CM | POA: Insufficient documentation

## 2017-11-13 DIAGNOSIS — Z87891 Personal history of nicotine dependence: Secondary | ICD-10-CM | POA: Insufficient documentation

## 2017-11-13 NOTE — ED Triage Notes (Signed)
Pt presents to ED with a c/o fall and right shoulder and knee pain. Pt was here as a visitor to pick up a patient and trip and fell in the parking lot.

## 2017-11-14 ENCOUNTER — Emergency Department (HOSPITAL_COMMUNITY)
Admission: EM | Admit: 2017-11-14 | Discharge: 2017-11-14 | Disposition: A | Discharge status: Home / Self Care | Payer: Medicaid Other | Attending: Emergency Medicine | Admitting: Emergency Medicine

## 2017-11-14 DIAGNOSIS — M25511 Pain in right shoulder: Secondary | ICD-10-CM

## 2017-11-14 DIAGNOSIS — M25561 Pain in right knee: Secondary | ICD-10-CM

## 2017-11-14 DIAGNOSIS — W19XXXA Unspecified fall, initial encounter: Secondary | ICD-10-CM

## 2017-11-14 MED ORDER — IBUPROFEN 600 MG PO TABS: 600.0000 mg | ORAL_TABLET | Freq: Four times a day (QID) | ORAL | 0 refills | Status: DC | PRN

## 2017-11-14 MED ORDER — OXYCODONE-ACETAMINOPHEN 5-325 MG PO TABS: 1.0000 | ORAL_TABLET | ORAL | 0 refills | Status: DC | PRN

## 2017-11-14 NOTE — Discharge Instructions (Addendum)
Follow up with your doctor for recheck if pain persists longer than 3-4 days. Cool compresses to the sore areas for the next 2 days then alternate with heat. Return here as needed.

## 2017-11-14 NOTE — ED Provider Notes (Signed)
[facility]-EMERGENCY DEPT Provider Note   CSN: [id number] Arrival date & time: 11/13/17  2156     History   Chief Complaint Chief Complaint  Patient presents with  . Knee Pain  . Shoulder Pain  . Fall    HPI [name] [name] is a 57 y.o. female.  Patient here for evaluation of right shoulder and right knee pain after tripping in a parking lot and landingon her side just prior to arrival. She denies hitting her head. She has been ambulatory since the fall. No N, V, chest, abdominal, or neck pain.   The history is provided by the patient. No language interpreter was used.  Knee Pain   Pertinent negatives include no numbness.  Shoulder Pain   Pertinent negatives include no numbness.  Fall     Past Medical History:  Diagnosis Date  . Arthritis   . Chronic back pain   . Fibromyalgia   . Hypertension     Patient Active Problem List   Diagnosis Date Noted  . Cervicalgia 03/05/2016  . Post laminectomy syndrome 03/05/2016  . Chronic pain syndrome 03/05/2016  . Fibromyalgia 03/05/2016  . Chronic right shoulder pain 11/25/2015    Past Surgical History:  Procedure Laterality Date  . NECK SURGERY       OB History   None      Home Medications    Prior to Admission medications   Medication Sig Start Date End Date Taking? Authorizing Provider  acetaminophen (TYLENOL) 500 MG tablet Take 1 tablet (500 mg total) by mouth every 6 (six) hours as needed. Patient taking differently: Take 1,500 mg by mouth every 6 (six) hours as needed (back spasms).  10/28/15   Cheri Fowler, PA-C  albuterol (PROVENTIL HFA;VENTOLIN HFA) 108 (90 Base) MCG/ACT inhaler Inhale 1-2 puffs into the lungs every 6 (six) hours as needed for wheezing or shortness of breath. 12/26/16   Dartha Lodge, PA-C  amLODipine (NORVASC) 5 MG tablet Take 5 mg by mouth daily. 04/22/17   [provider]  atorvastatin (LIPITOR) 10 MG tablet Take 10 mg by mouth daily. 04/22/17   [provider]  benzonatate (TESSALON) 100 MG capsule Take 1 capsule (100 mg total) by mouth every 8 (eight) hours. Patient not taking: Reported on 05/14/2017 12/26/16   Dartha Lodge, PA-C  cetirizine (ZYRTEC ALLERGY) 10 MG tablet Take 1 tablet (10 mg total) by mouth daily. Patient not taking: Reported on 05/14/2017 12/26/16   Dartha Lodge, PA-C  cyclobenzaprine (FLEXERIL) 10 MG tablet Take 1 tablet (10 mg total) by mouth 3 (three) times daily as needed for muscle spasms. Patient not taking: Reported on 02/10/2016 10/29/14   Linwood Dibbles, MD  diazepam (VALIUM) 5 MG tablet Take 1 tablet (5 mg total) by mouth every 6 (six) hours as needed for muscle spasms. 05/14/17   Loren Racer, MD  escitalopram (LEXAPRO) 10 MG tablet Take 10 mg by mouth daily. 04/22/17   [provider]  famotidine (PEPCID) 40 MG tablet Take 1 tablet (40 mg total) by mouth daily. Patient not taking: Reported on 05/14/2017 03/02/17   Maczis, Elmer Sow, PA-C  gabapentin (NEURONTIN) 300 MG capsule Take 300 mg by mouth 4 (four) times daily. 04/22/17   [provider]  hydrochlorothiazide (HYDRODIURIL) 25 MG tablet Take 1 tablet (25 mg total) by mouth daily. 08/17/14   Muthersbaugh, Dahlia Client, PA-C  HYDROcodone-acetaminophen (NORCO) 5-325 MG tablet Take 1 tablet by mouth every 6 (six) hours as needed for  severe pain. 05/14/17   Loren Racer, MD  hydrOXYzine (ATARAX/VISTARIL) 25 MG tablet Take 0.5-1 tablets (12.5-25 mg total) by mouth every 8 (eight) hours as needed for itching. Patient not taking: Reported on 05/14/2017 03/02/17   Maczis, Elmer Sow, PA-C  ibuprofen (ADVIL,MOTRIN) 600 MG tablet Take 1 tablet (600 mg total) by mouth 3 (three) times daily with meals as needed for moderate pain. 05/14/17   Loren Racer, MD  indomethacin (INDOCIN) 25 MG capsule Take 1 capsule (25 mg total) by mouth 3 (three) times daily as needed. Patient not taking: Reported on 02/10/2016 08/05/13   Marlon Pel, PA-C  meclizine (ANTIVERT)  50 MG tablet Take 0.5 tablets (25 mg total) by mouth 3 (three) times daily as needed. Patient not taking: Reported on 02/10/2016 01/13/15   Barrett Henle, PA-C  naproxen (NAPROSYN) 500 MG tablet Take 1 tablet (500 mg total) by mouth 2 (two) times daily. Patient not taking: Reported on 12/16/2015 10/28/15   Cheri Fowler, PA-C  oxyCODONE-acetaminophen (PERCOCET/ROXICET) 5-325 MG tablet Take 1-2 tablets by mouth every 4 (four) hours as needed for severe pain. Patient not taking: Reported on 12/16/2015 02/01/15   Cheri Fowler, PA-C  oxymetazoline (AFRIN NASAL SPRAY) 0.05 % nasal spray Place 1 spray into both nostrils 2 (two) times daily. Patient not taking: Reported on 02/10/2016 01/13/15   Barrett Henle, PA-C  pantoprazole (PROTONIX) 20 MG tablet Take 1 tablet (20 mg total) by mouth daily. 02/10/16   Mancel Bale, MD  predniSONE (DELTASONE) 20 MG tablet Take three 20 mg tablets once a day for 2 days, then two tablets once a day for 2 days, and then one tablet once a day for 2 days Patient not taking: Reported on 05/14/2017 03/02/17   Maczis, Elmer Sow, PA-C  traMADol (ULTRAM) 50 MG tablet Take 1 tablet (50 mg total) by mouth every 6 (six) hours as needed for moderate pain or severe pain. Patient not taking: Reported on 05/14/2017 03/09/16   Tyrell Antonio, MD    Family History No family history on file.  Social History Social History   Tobacco Use  . Smoking status: Former Games developer  . Smokeless tobacco: Never Used  Substance Use Topics  . Alcohol use: Yes    Comment: socially  . Drug use: No     Allergies   Patient has no known allergies.   Review of Systems Review of Systems  Constitutional: Negative for diaphoresis.  Gastrointestinal: Negative for nausea and vomiting.  Musculoskeletal: Negative for back pain and neck pain.       See HPI.  Skin: Negative for color change and wound.  Neurological: Negative for dizziness, weakness and numbness.     Physical  Exam Updated Vital Signs BP (!) 153/88 (BP Location: Left Arm)   Pulse 71   Temp 98.5 F (36.9 C) (Oral)   Resp 16   Ht 5\' 1"  (1.549 m)   Wt 80.7 kg   SpO2 98%   BMI 33.63 kg/m   Physical Exam  Constitutional: She is oriented to person, place, and time. She appears well-developed and well-nourished.  Neck: Normal range of motion.  Cardiovascular: Intact distal pulses.  Pulmonary/Chest: Effort normal.  Musculoskeletal:  No midline cervical or paracervical tenderness. FROM UE's without strength deficits. Right shoulder without deformity and is generally tender. No clavicular tenderness. Elbow and wrist has a full, pain-free ROM. No pelvic or right hip tenderness. Right knee is not swollen or deformed. ROM limited by pain. No calf or ankle tenderness.  FROM LE's.   Neurological: She is alert and oriented to person, place, and time.  Skin: Skin is warm and dry.     ED Treatments / Results  Labs (all labs ordered are listed, but only abnormal results are displayed) Labs Reviewed - No data to display  EKG None  Radiology Dg Shoulder Right  Result Date: 11/13/2017 CLINICAL DATA:  Pt presents to ED with a c/o fall and right shoulder and knee pain. Pt was here as a visitor to pick up a patient and trip and fell in the parking lot. EXAM: RIGHT SHOULDER - 2+ VIEW COMPARISON:  10/28/2015 FINDINGS: There is no evidence of fracture or dislocation. There is no evidence of arthropathy or other focal bone abnormality. Soft tissues are unremarkable. IMPRESSION: Negative. Electronically Signed   By: Norva Pavlov M.D.   On: 11/13/2017 22:39   Dg Knee Complete 4 Views Right  Result Date: 11/13/2017 CLINICAL DATA:  57 year old female with fall and right knee pain. EXAM: RIGHT KNEE - COMPLETE 4+ VIEW COMPARISON:  None. FINDINGS: There is no acute fracture or dislocation. The bones are mildly osteopenic. No significant arthritic changes. No joint effusion. The soft tissues are unremarkable.  IMPRESSION: Negative. Electronically Signed   By: Elgie Collard M.D.   On: 11/13/2017 22:45    Procedures Procedures (including critical care time)  Medications Ordered in ED Medications - No data to display   Initial Impression / Assessment and Plan / ED Course  I have reviewed the triage vital signs and the nursing notes.  Pertinent labs & imaging results that were available during my care of the patient were reviewed by me and considered in my medical decision making (see chart for details).     Patient here after fall with right shoulder and knee pain. Negative imaging. She is not felt to have injuries other than the right shoulder and knee that have been evaluated. She can be discharged home. She has been ambulatory without limitation or instability.  Final Clinical Impressions(s) / ED Diagnoses   Final diagnoses:  None   1. Fall 2. Musculoskeletal pain  ED Discharge Orders    None       Elpidio Anis, PA-C 11/14/17 0124    Molpus, Jonny Ruiz, MD 11/14/17 [phone]

## 2018-02-13 IMAGING — CR DG CHEST 2V
2 series · 2 of 2 positions shown · non-contrast
Comparison: CT chest and single view of the chest 03/06/2015.

CLINICAL DATA: Intermittent chest pain, nausea and shortness of
breath for 2 months, worsened over the past 2 days.

EXAM:
CHEST  2 VIEW

[w chest pa]
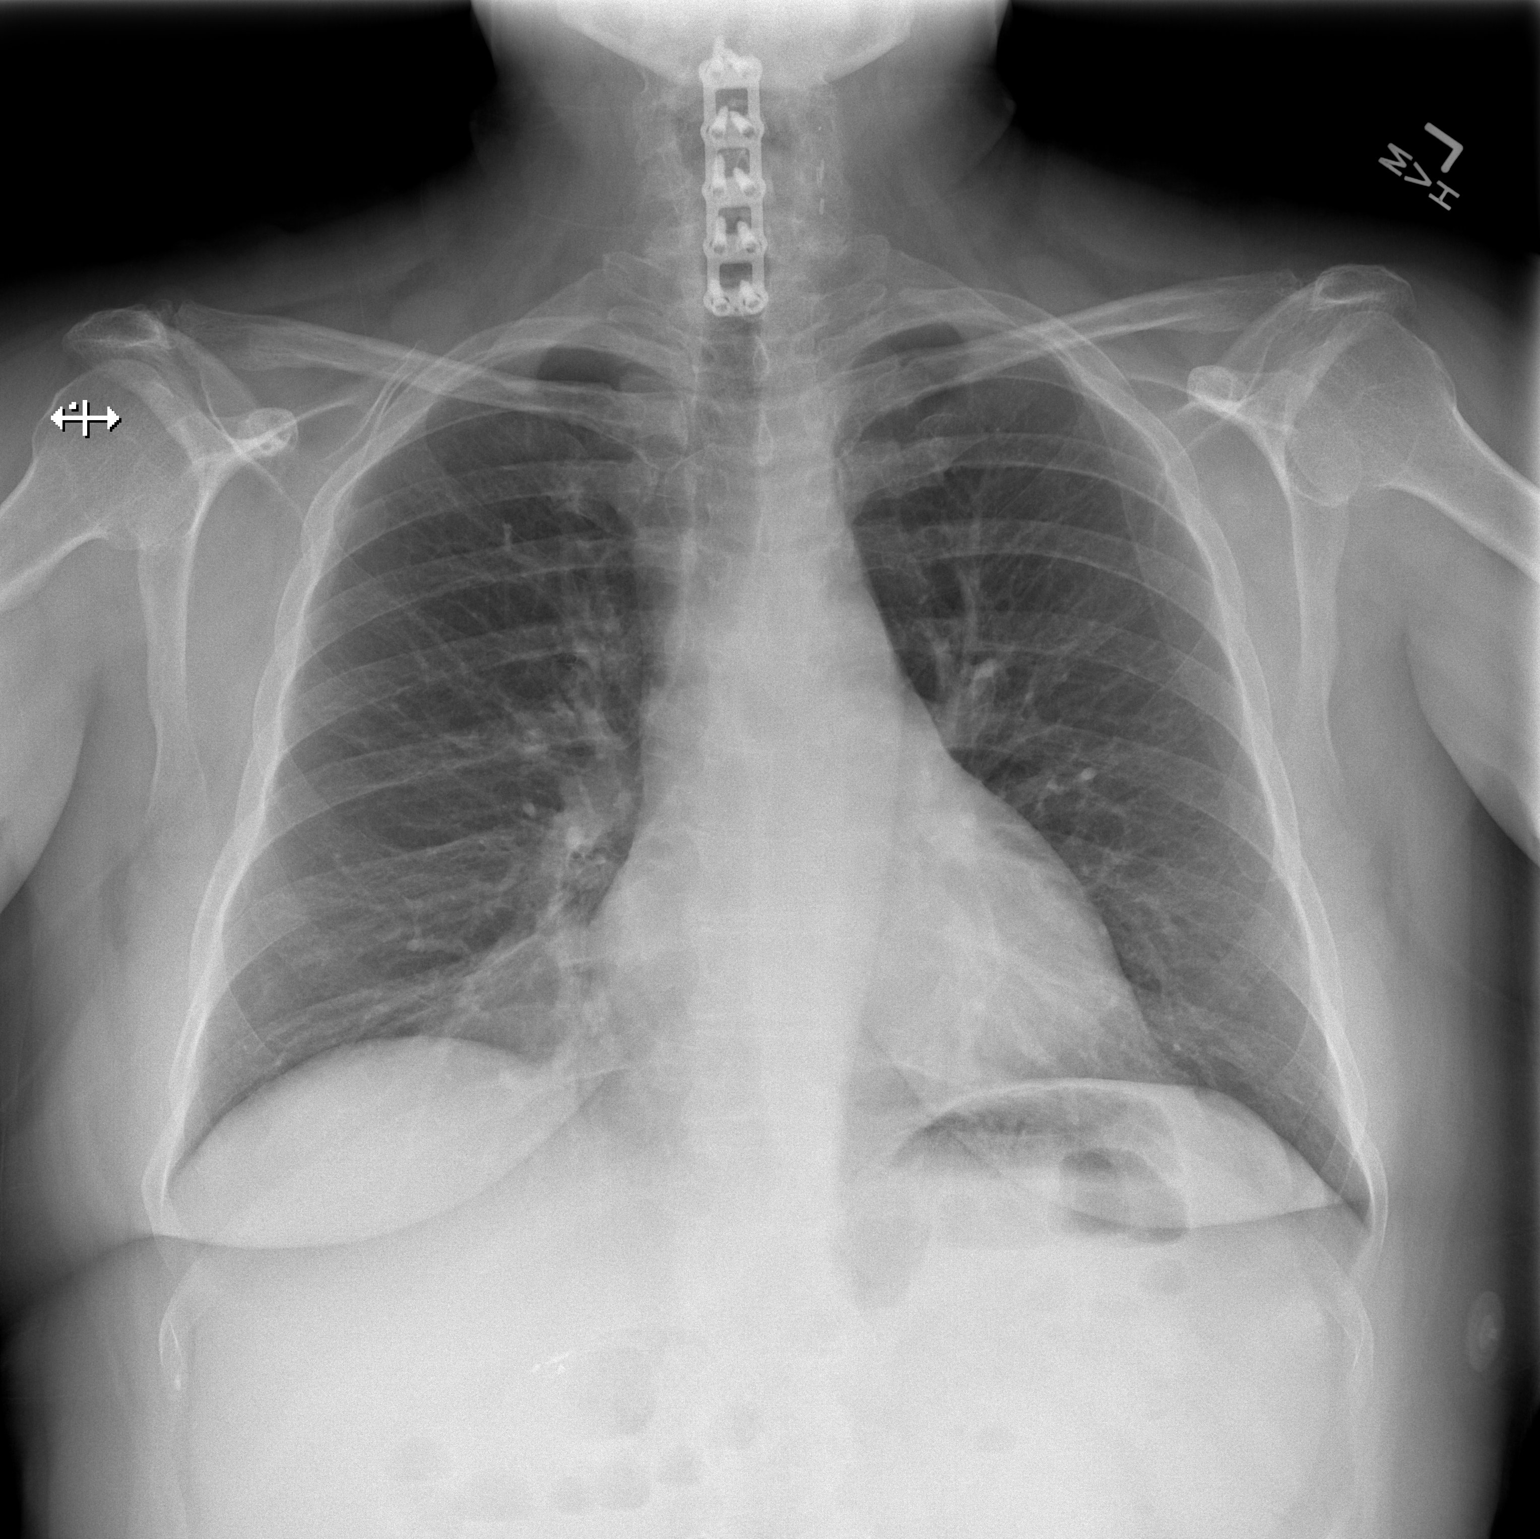

[w chest lat]
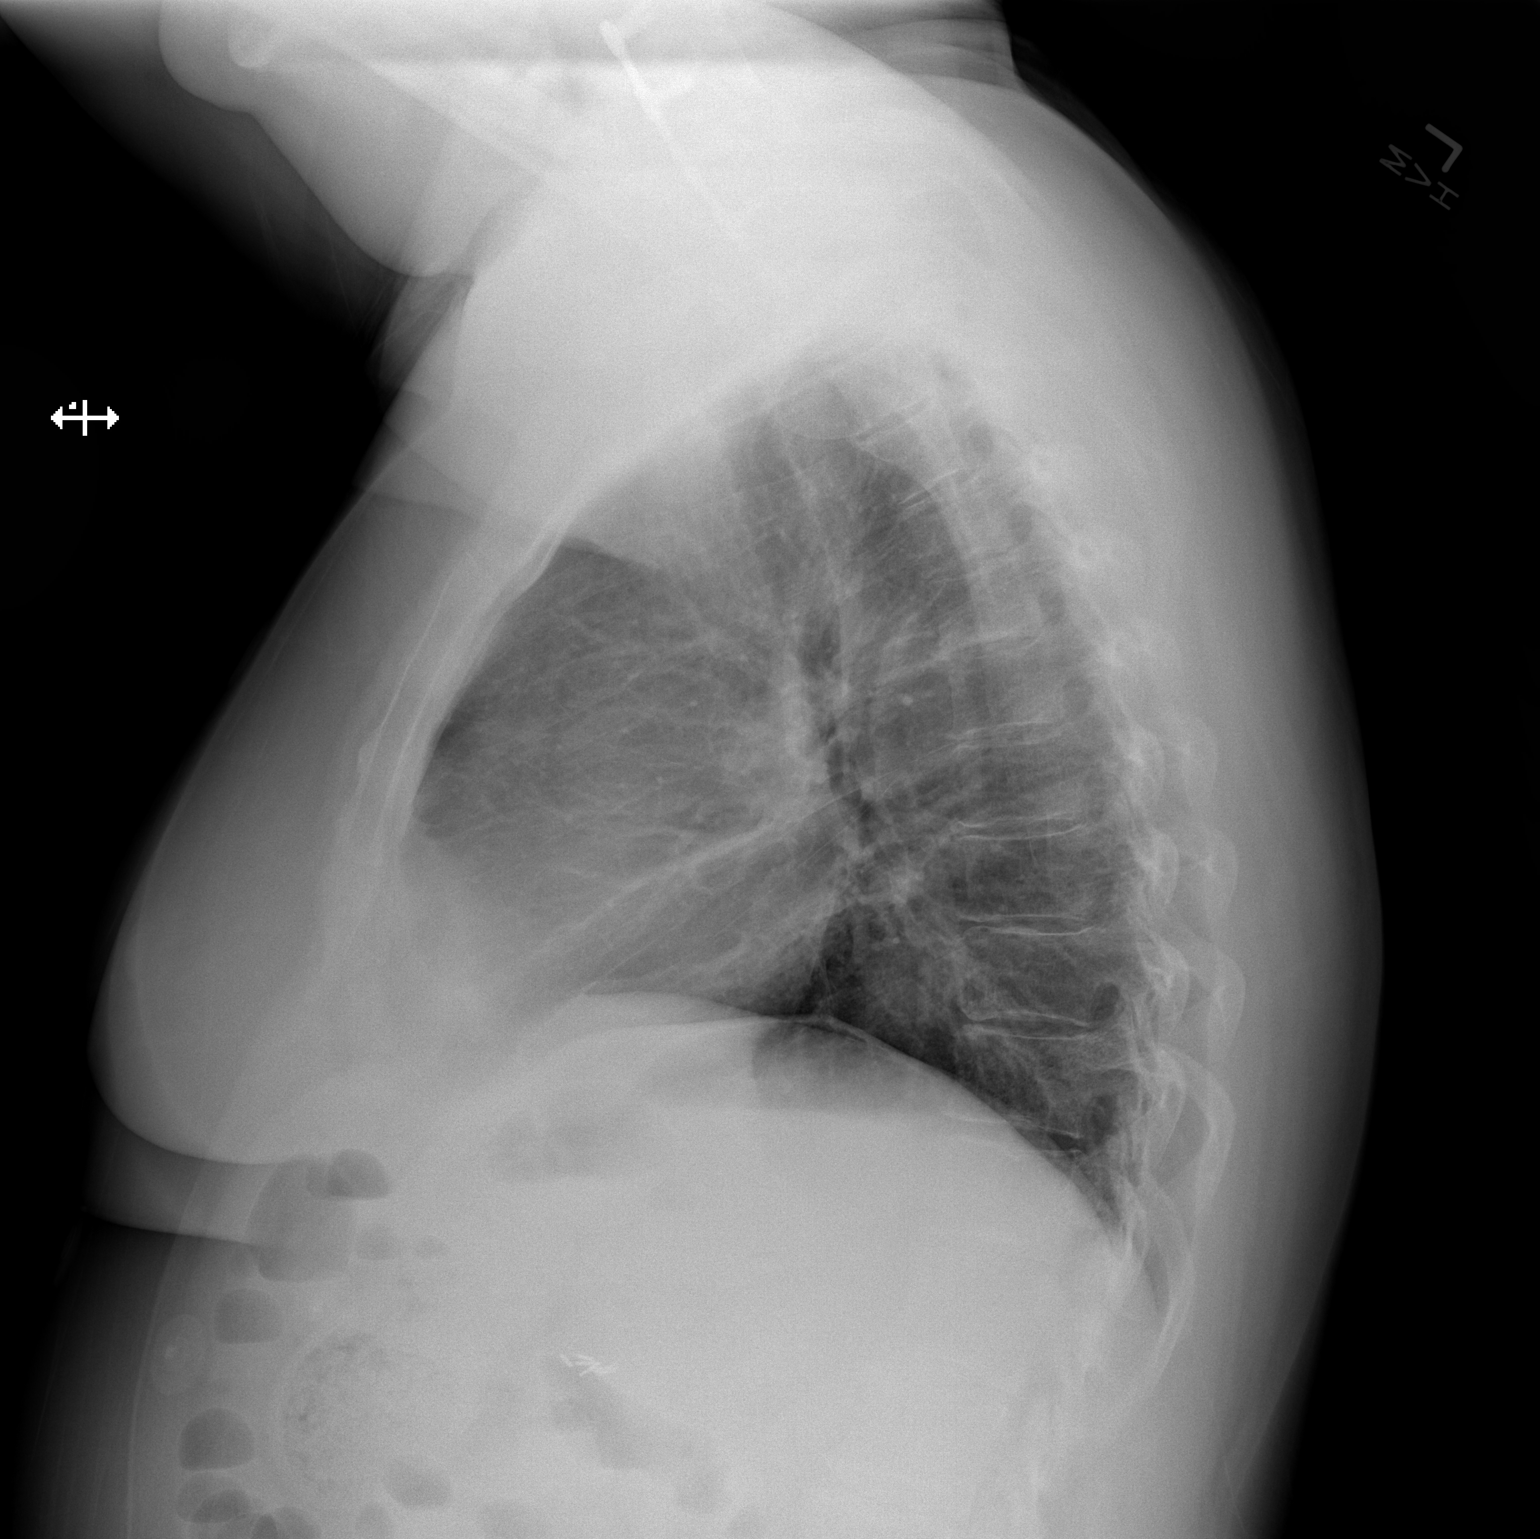

[2 of 2 positions shown; findings below may reference images not displayed]

FINDINGS: Lungs are clear. Heart size is normal. No pneumothorax or pleural
effusion. No bony abnormality. The patient is status post cervical
fusion and cholecystectomy.
IMPRESSION: No acute disease.

## 2018-02-14 ENCOUNTER — Encounter (HOSPITAL_COMMUNITY): Payer: Self-pay

## 2018-02-14 ENCOUNTER — Emergency Department (HOSPITAL_COMMUNITY): Payer: Medicaid Other

## 2018-02-14 ENCOUNTER — Other Ambulatory Visit: Payer: Self-pay

## 2018-02-14 ENCOUNTER — Emergency Department (HOSPITAL_COMMUNITY)
Admission: EM | Admit: 2018-02-14 | Discharge: 2018-02-14 | Disposition: A | Discharge status: Home / Self Care | Payer: Medicaid Other | Attending: Emergency Medicine | Admitting: Emergency Medicine

## 2018-02-14 DIAGNOSIS — Z87891 Personal history of nicotine dependence: Secondary | ICD-10-CM | POA: Insufficient documentation

## 2018-02-14 DIAGNOSIS — E876 Hypokalemia: Secondary | ICD-10-CM | POA: Insufficient documentation

## 2018-02-14 DIAGNOSIS — Z3202 Encounter for pregnancy test, result negative: Secondary | ICD-10-CM | POA: Diagnosis not present

## 2018-02-14 DIAGNOSIS — Z79899 Other long term (current) drug therapy: Secondary | ICD-10-CM | POA: Insufficient documentation

## 2018-02-14 DIAGNOSIS — R1012 Left upper quadrant pain: Secondary | ICD-10-CM | POA: Diagnosis present

## 2018-02-14 DIAGNOSIS — I10 Essential (primary) hypertension: Secondary | ICD-10-CM | POA: Insufficient documentation

## 2018-02-14 LAB — COMPREHENSIVE METABOLIC PANEL
ALT: 34 U/L (ref 0–44)
AST: 36 U/L (ref 15–41)
Albumin: 5 g/dL (ref 3.5–5.0)
Alkaline Phosphatase: 54 U/L (ref 38–126)
Anion gap: 15 (ref 5–15)
BUN: 21 mg/dL — ABNORMAL HIGH (ref 6–20)
CO2: 26 mmol/L (ref 22–32)
Calcium: 9.2 mg/dL (ref 8.9–10.3)
Chloride: 97 mmol/L — ABNORMAL LOW (ref 98–111)
Creatinine, Ser: 0.98 mg/dL (ref 0.44–1.00)
Glucose, Bld: 122 mg/dL — ABNORMAL HIGH (ref 70–99)
Potassium: 2.9 mmol/L — ABNORMAL LOW (ref 3.5–5.1)
Sodium: 138 mmol/L (ref 135–145)
Total Bilirubin: 1 mg/dL (ref 0.3–1.2)
Total Protein: 8.8 g/dL — ABNORMAL HIGH (ref 6.5–8.1)

## 2018-02-14 LAB — CBC
HCT: 49.1 % — ABNORMAL HIGH (ref 36.0–46.0)
Hemoglobin: 16.6 g/dL — ABNORMAL HIGH (ref 12.0–15.0)
MCH: 31 pg (ref 26.0–34.0)
MCHC: 33.8 g/dL (ref 30.0–36.0)
MCV: 91.8 fL (ref 80.0–100.0)
Platelets: 281 10*3/uL (ref 150–400)
RBC: 5.35 MIL/uL — ABNORMAL HIGH (ref 3.87–5.11)
RDW: 12.2 % (ref 11.5–15.5)
WBC: 9.8 10*3/uL (ref 4.0–10.5)
nRBC: 0 % (ref 0.0–0.2)

## 2018-02-14 LAB — URINALYSIS, ROUTINE W REFLEX MICROSCOPIC
Bacteria, UA: NONE SEEN
Bilirubin Urine: NEGATIVE
Glucose, UA: NEGATIVE mg/dL
Hgb urine dipstick: NEGATIVE
Ketones, ur: 5 mg/dL — AB
Leukocytes, UA: NEGATIVE
Nitrite: NEGATIVE
PH: 7 (ref 5.0–8.0)
Protein, ur: 30 mg/dL — AB
Specific Gravity, Urine: >1.046 — ABNORMAL HIGH (ref 1.005–1.030)

## 2018-02-14 LAB — I-STAT BETA HCG BLOOD, ED (MC, WL, AP ONLY): I-stat hCG, quantitative: 10.6 m[IU]/mL — ABNORMAL HIGH (ref <5)

## 2018-02-14 LAB — PREGNANCY, URINE: Preg Test, Ur: NEGATIVE

## 2018-02-14 LAB — LIPASE, BLOOD: Lipase: 42 U/L (ref 11–51)

## 2018-02-14 MED ORDER — ONDANSETRON HCL 4 MG PO TABS: 4.0000 mg | ORAL_TABLET | Freq: Four times a day (QID) | ORAL | 0 refills | Status: AC | PRN

## 2018-02-14 MED ORDER — IOPAMIDOL (ISOVUE-300) INJECTION 61%
INTRAVENOUS | Status: AC
  Filled 2018-02-14: qty 100

## 2018-02-14 MED ORDER — PANTOPRAZOLE SODIUM 20 MG PO TBEC: 20.0000 mg | DELAYED_RELEASE_TABLET | Freq: Every day | ORAL | 3 refills | Status: AC

## 2018-02-14 MED ORDER — IOPAMIDOL (ISOVUE-300) INJECTION 61%
100.0000 mL | Freq: Once | INTRAVENOUS | Status: AC | PRN
  Administered 2018-02-14: 100 mL via INTRAVENOUS

## 2018-02-14 MED ORDER — ONDANSETRON HCL 4 MG/2ML IJ SOLN
4.0000 mg | Freq: Once | INTRAMUSCULAR | Status: AC
  Administered 2018-02-14: 4 mg via INTRAVENOUS
  Filled 2018-02-14: qty 2

## 2018-02-14 MED ORDER — SODIUM CHLORIDE 0.9 % IV BOLUS
1000.0000 mL | Freq: Once | INTRAVENOUS | Status: AC
  Administered 2018-02-14: 1000 mL via INTRAVENOUS

## 2018-02-14 MED ORDER — MORPHINE SULFATE (PF) 4 MG/ML IV SOLN
4.0000 mg | Freq: Once | INTRAVENOUS | Status: AC
  Administered 2018-02-14: 4 mg via INTRAVENOUS
  Filled 2018-02-14: qty 1

## 2018-02-14 MED ORDER — DICYCLOMINE HCL 20 MG PO TABS: 20.0000 mg | ORAL_TABLET | Freq: Two times a day (BID) | ORAL | 0 refills | Status: DC | PRN

## 2018-02-14 MED ORDER — SUCRALFATE 1 G PO TABS: 1.0000 g | ORAL_TABLET | Freq: Three times a day (TID) | ORAL | 0 refills | Status: DC

## 2018-02-14 MED ORDER — SODIUM CHLORIDE 0.9% FLUSH
3.0000 mL | Freq: Once | INTRAVENOUS | Status: AC
  Administered 2018-02-14: 3 mL via INTRAVENOUS

## 2018-02-14 MED ORDER — SODIUM CHLORIDE (PF) 0.9 % IJ SOLN
INTRAMUSCULAR | Status: AC
  Filled 2018-02-14: qty 50

## 2018-02-14 MED ORDER — POTASSIUM CHLORIDE 10 MEQ/100ML IV SOLN
10.0000 meq | INTRAVENOUS | Status: AC
  Administered 2018-02-14 (×2): 10 meq via INTRAVENOUS
  Filled 2018-02-14 (×2): qty 100

## 2018-02-14 MED ORDER — POTASSIUM CHLORIDE CRYS ER 20 MEQ PO TBCR: 20.0000 meq | EXTENDED_RELEASE_TABLET | Freq: Every day | ORAL | 0 refills | Status: DC

## 2018-02-14 NOTE — ED Notes (Signed)
Pt ambulated to BR without difficulty urine specimen obtained

## 2018-02-14 NOTE — ED Notes (Signed)
Pt asked for something else to help with nausea. Reita Cliche, RN notified.

## 2018-02-14 NOTE — ED Triage Notes (Signed)
Pt BIBA from home. Since yesterday, pt has had left flank abd pain. Woke up this morning with N/V/D. Pt has vomited 5-6 x today.

## 2018-02-14 NOTE — ED Notes (Signed)
1 episode of vomiting after returning from CT.

## 2018-02-14 NOTE — ED Provider Notes (Signed)
[facility]-EMERGENCY DEPT Provider Note   CSN: [id number] Arrival date & time: 02/14/18  1352     History   Chief Complaint Chief Complaint  Patient presents with  . Abdominal Pain    HPI [name] [name] is a 58 y.o. female.  HPI Patient states for the past 3 to 4 days she has had decreased appetite and oral intake.  She states she is been unable to sleep.  Started having left-sided abdominal pain associated with multiple episodes of nonbloody/non-bilious vomiting.  She also has had multiple episodes of watery diarrhea.  Denies any urinary symptoms.  No hematuria, frequency or urgency.  She does have left-sided flank pain which radiates to her abdomen.  No known sick contacts.  Patient has had previous cholecystectomy and appendectomy. Past Medical History:  Diagnosis Date  . Arthritis   . Chronic back pain   . Fibromyalgia   . Hypertension     Patient Active Problem List   Diagnosis Date Noted  . Cervicalgia 03/05/2016  . Post laminectomy syndrome 03/05/2016  . Chronic pain syndrome 03/05/2016  . Fibromyalgia 03/05/2016  . Chronic right shoulder pain 11/25/2015    Past Surgical History:  Procedure Laterality Date  . NECK SURGERY       OB History   No obstetric history on file.      Home Medications    Prior to Admission medications   Medication Sig Start Date End Date Taking? Authorizing Provider  acetaminophen (TYLENOL) 500 MG tablet Take 1 tablet (500 mg total) by mouth every 6 (six) hours as needed. Patient taking differently: Take 500 mg by mouth every 6 (six) hours as needed for headache.  10/28/15  Yes Cheri Fowler, PA-C  albuterol (PROVENTIL HFA;VENTOLIN HFA) 108 (90 Base) MCG/ACT inhaler Inhale 1-2 puffs into the lungs every 6 (six) hours as needed for wheezing or shortness of breath. 12/26/16  Yes Dartha Lodge, PA-C  amLODipine (NORVASC) 5 MG tablet Take 5 mg by mouth daily. 04/22/17  Yes [provider]  gabapentin  (NEURONTIN) 300 MG capsule Take 300 mg by mouth 4 (four) times daily. 04/22/17  Yes [provider]  hydrochlorothiazide (HYDRODIURIL) 25 MG tablet Take 1 tablet (25 mg total) by mouth daily. 08/17/14  Yes Muthersbaugh, Dahlia Client, PA-C  benzonatate (TESSALON) 100 MG capsule Take 1 capsule (100 mg total) by mouth every 8 (eight) hours. Patient not taking: Reported on 05/14/2017 12/26/16   Dartha Lodge, PA-C  cetirizine (ZYRTEC ALLERGY) 10 MG tablet Take 1 tablet (10 mg total) by mouth daily. Patient not taking: Reported on 05/14/2017 12/26/16   Dartha Lodge, PA-C  cyclobenzaprine (FLEXERIL) 10 MG tablet Take 1 tablet (10 mg total) by mouth 3 (three) times daily as needed for muscle spasms. Patient not taking: Reported on 02/10/2016 10/29/14   Linwood Dibbles, MD  diazepam (VALIUM) 5 MG tablet Take 1 tablet (5 mg total) by mouth every 6 (six) hours as needed for muscle spasms. Patient not taking: Reported on 02/14/2018 05/14/17   Loren Racer, MD  dicyclomine (BENTYL) 20 MG tablet Take 1 tablet (20 mg total) by mouth 2 (two) times daily as needed for spasms. 02/14/18   Loren Racer, MD  famotidine (PEPCID) 40 MG tablet Take 1 tablet (40 mg total) by mouth daily. Patient not taking: Reported on 05/14/2017 03/02/17   Maczis, Elmer Sow, PA-C  HYDROcodone-acetaminophen (NORCO) 5-325 MG tablet Take 1 tablet by mouth every 6 (six) hours as needed for severe pain. Patient not  taking: Reported on 02/14/2018 05/14/17   Loren RacerYelverton, Jamielynn Wigley, MD  hydrOXYzine (ATARAX/VISTARIL) 25 MG tablet Take 0.5-1 tablets (12.5-25 mg total) by mouth every 8 (eight) hours as needed for itching. Patient not taking: Reported on 05/14/2017 03/02/17   Maczis, Elmer SowMichael M, PA-C  ibuprofen (ADVIL,MOTRIN) 600 MG tablet Take 1 tablet (600 mg total) by mouth every 6 (six) hours as needed. Patient not taking: Reported on 02/14/2018 11/14/17   Elpidio AnisUpstill, Shari, PA-C  indomethacin (INDOCIN) 25 MG capsule Take 1 capsule (25 mg total) by mouth 3  (three) times daily as needed. Patient not taking: Reported on 02/10/2016 08/05/13   Marlon PelGreene, Tiffany, PA-C  meclizine (ANTIVERT) 50 MG tablet Take 0.5 tablets (25 mg total) by mouth 3 (three) times daily as needed. Patient not taking: Reported on 02/10/2016 01/13/15   Barrett HenleNadeau, Nicole Elizabeth, PA-C  naproxen (NAPROSYN) 500 MG tablet Take 1 tablet (500 mg total) by mouth 2 (two) times daily. Patient not taking: Reported on 12/16/2015 10/28/15   Cheri Fowlerose, Kayla, PA-C  ondansetron (ZOFRAN) 4 MG tablet Take 1 tablet (4 mg total) by mouth every 6 (six) hours as needed for nausea or vomiting. 02/14/18   Loren RacerYelverton, Dorise Gangi, MD  oxyCODONE-acetaminophen (PERCOCET/ROXICET) 5-325 MG tablet Take 1 tablet by mouth every 4 (four) hours as needed for severe pain. Patient not taking: Reported on 02/14/2018 11/14/17   Elpidio AnisUpstill, Shari, PA-C  oxymetazoline (AFRIN NASAL SPRAY) 0.05 % nasal spray Place 1 spray into both nostrils 2 (two) times daily. Patient not taking: Reported on 02/10/2016 01/13/15   Barrett HenleNadeau, Nicole Elizabeth, PA-C  pantoprazole (PROTONIX) 20 MG tablet Take 1 tablet (20 mg total) by mouth daily. 02/14/18   Loren RacerYelverton, Zasha Belleau, MD  potassium chloride SA (K-DUR,KLOR-CON) 20 MEQ tablet Take 1 tablet (20 mEq total) by mouth daily. 02/14/18   Loren RacerYelverton, Jazyiah Yiu, MD  predniSONE (DELTASONE) 20 MG tablet Take three 20 mg tablets once a day for 2 days, then two tablets once a day for 2 days, and then one tablet once a day for 2 days Patient not taking: Reported on 05/14/2017 03/02/17   Maczis, Elmer SowMichael M, PA-C  sucralfate (CARAFATE) 1 g tablet Take 1 tablet (1 g total) by mouth 4 (four) times daily -  with meals and at bedtime. 02/14/18   Loren RacerYelverton, Angeliki Mates, MD  traMADol (ULTRAM) 50 MG tablet Take 1 tablet (50 mg total) by mouth every 6 (six) hours as needed for moderate pain or severe pain. Patient not taking: Reported on 05/14/2017 03/09/16   Tyrell AntonioNewton, Frederic, MD    Family History No family history on file.  Social History Social  History   Tobacco Use  . Smoking status: Former Games developermoker  . Smokeless tobacco: Never Used  Substance Use Topics  . Alcohol use: Yes    Comment: socially  . Drug use: No     Allergies   Patient has no known allergies.   Review of Systems Review of Systems  Constitutional: Positive for appetite change. Negative for chills and fever.  HENT: Negative for sore throat and trouble swallowing.   Eyes: Negative for visual disturbance.  Respiratory: Negative for cough and shortness of breath.   Cardiovascular: Negative for chest pain.  Gastrointestinal: Positive for abdominal pain, diarrhea, nausea and vomiting. Negative for blood in stool.  Genitourinary: Positive for flank pain. Negative for dysuria, frequency, hematuria and pelvic pain.  Musculoskeletal: Positive for back pain, myalgias and neck pain. Negative for neck stiffness.  Skin: Negative for rash and wound.  Neurological: Negative for dizziness, weakness, light-headedness,  numbness and headaches.     Physical Exam Updated Vital Signs BP (!) 157/91 (BP Location: Left Arm)   Pulse 77   Temp 98 F (36.7 C) (Oral)   Resp 18   Wt 80.7 kg   SpO2 96%   BMI 33.62 kg/m   Physical Exam Vitals signs and nursing note reviewed.  Constitutional:      General: She is not in acute distress.    Appearance: Normal appearance. She is well-developed. She is not ill-appearing.  HENT:     Head: Normocephalic and atraumatic.     Nose: Nose normal. No congestion.     Mouth/Throat:     Mouth: Mucous membranes are moist.     Pharynx: No oropharyngeal exudate or posterior oropharyngeal erythema.  Eyes:     Extraocular Movements: Extraocular movements intact.     Pupils: Pupils are equal, round, and reactive to light.  Neck:     Musculoskeletal: Normal range of motion and neck supple. Muscular tenderness present. No neck rigidity.     Comments: Patient has left trapezius spasm and tenderness to palpation.  No  meningismus. Cardiovascular:     Rate and Rhythm: Regular rhythm. Tachycardia present.     Heart sounds: No murmur. No friction rub. No gallop.   Pulmonary:     Effort: Pulmonary effort is normal. No respiratory distress.     Breath sounds: Normal breath sounds. No stridor. No wheezing, rhonchi or rales.  Chest:     Chest wall: No tenderness.  Abdominal:     General: Bowel sounds are normal.     Palpations: Abdomen is soft.     Tenderness: There is abdominal tenderness. There is no guarding or rebound.     Comments: Left upper quadrant and left lower quadrant tenderness to palpation.  No rebound or guarding.  Musculoskeletal: Normal range of motion.        General: No swelling, tenderness, deformity or signs of injury.     Right lower leg: No edema.     Left lower leg: No edema.     Comments: No CVA tenderness bilaterally.  No lower extremity swelling, asymmetry or tenderness.  Distal pulses intact.  Lymphadenopathy:     Cervical: No cervical adenopathy.  Skin:    General: Skin is warm and dry.     Capillary Refill: Capillary refill takes less than 2 seconds.     Findings: No erythema or rash.  Neurological:     General: No focal deficit present.     Mental Status: She is alert and oriented to person, place, and time.  Psychiatric:        Mood and Affect: Mood normal.        Behavior: Behavior normal.      ED Treatments / Results  Labs (all labs ordered are listed, but only abnormal results are displayed) Labs Reviewed  COMPREHENSIVE METABOLIC PANEL - Abnormal; Notable for the following components:      Result Value   Potassium 2.9 (*)    Chloride 97 (*)    Glucose, Bld 122 (*)    BUN 21 (*)    Total Protein 8.8 (*)    All other components within normal limits  CBC - Abnormal; Notable for the following components:   RBC 5.35 (*)    Hemoglobin 16.6 (*)    HCT 49.1 (*)    All other components within normal limits  URINALYSIS, ROUTINE W REFLEX MICROSCOPIC - Abnormal;  Notable for the following components:  Specific Gravity, Urine >1.046 (*)    Ketones, ur 5 (*)    Protein, ur 30 (*)    All other components within normal limits  I-STAT BETA HCG BLOOD, ED ([facility], [facility], [facility] ONLY) - Abnormal; Notable for the following components:   I-stat hCG, quantitative 10.6 (*)    All other components within normal limits  LIPASE, BLOOD  PREGNANCY, URINE    EKG None  Radiology Ct Abdomen Pelvis W Contrast  Result Date: 02/14/2018 CLINICAL DATA:  Left-sided abdominal and flank pain beginning yesterday. Nausea and vomiting and diarrhea. EXAM: CT ABDOMEN AND PELVIS WITH CONTRAST TECHNIQUE: Multidetector CT imaging of the abdomen and pelvis was performed using the standard protocol following bolus administration of intravenous contrast. CONTRAST:  ISOVUE-300 IOPAMIDOL (ISOVUE-300) INJECTION 61% COMPARISON:  07/16/2016 FINDINGS: Lower Chest: No acute findings. Hepatobiliary: No hepatic masses identified. Moderate to severe hepatic steatosis is increased since previous study. Prior cholecystectomy. No evidence of biliary obstruction. Pancreas:  No mass or inflammatory changes. Spleen: Within normal limits in size and appearance. Adrenals/Urinary Tract: No masses identified. No evidence of hydronephrosis. Unremarkable unopacified urinary bladder. Stomach/Bowel: No evidence of obstruction, inflammatory process or abnormal fluid collections. Mild Diverticulosis is seen mainly involving the sigmoid colon, however there is no evidence of diverticulitis. Vascular/Lymphatic: No pathologically enlarged lymph nodes. No abdominal aortic aneurysm. Reproductive:  No mass or other significant abnormality. Other:  None. Musculoskeletal:  No suspicious bone lesions identified. IMPRESSION: 1. Mild sigmoid diverticulosis, without radiographic evidence of diverticulitis or other acute findings. 2. Moderate to severe hepatic steatosis. Electronically Signed   By: Myles Rosenthal M.D.   On: 02/14/2018 17:53     Procedures Procedures (including critical care time)  Medications Ordered in ED Medications  iopamidol (ISOVUE-300) 61 % injection (has no administration in time range)  sodium chloride (PF) 0.9 % injection (has no administration in time range)  sodium chloride flush (NS) 0.9 % injection 3 mL (3 mLs Intravenous Given 02/14/18 1647)  sodium chloride 0.9 % bolus 1,000 mL (1,000 mLs Intravenous New Bag/Given 02/14/18 1647)  morphine 4 MG/ML injection 4 mg (4 mg Intravenous Given 02/14/18 1647)  ondansetron (ZOFRAN) injection 4 mg (4 mg Intravenous Given 02/14/18 1647)  potassium chloride 10 mEq in 100 mL IVPB (0 mEq Intravenous Stopped 02/14/18 1917)  iopamidol (ISOVUE-300) 61 % injection 100 mL (100 mLs Intravenous Contrast Given 02/14/18 1713)  morphine 4 MG/ML injection 4 mg (4 mg Intravenous Given 02/14/18 2054)  sodium chloride 0.9 % bolus 1,000 mL (1,000 mLs Intravenous New Bag/Given 02/14/18 2053)  ondansetron (ZOFRAN) injection 4 mg (4 mg Intravenous Given 02/14/18 2053)     Initial Impression / Assessment and Plan / ED Course  I have reviewed the triage vital signs and the nursing notes.  Pertinent labs & imaging results that were available during my care of the patient were reviewed by me and considered in my medical decision making (see chart for details).     CT abdomen pelvis without acute findings.  Normal white blood cell count.  Patient does have a history of previous peptic ulcer disease.  She is no longer on a PPI.  She also has been taking NSAIDs frequently.  Suspect some of her symptoms may be related to NSAID induced gastritis.  Will place back on PPI and start on Carafate.  Given potassium replacement in the emergency department.  Advised to follow-up closely with gastroenterology.  Advised to avoid all NSAIDs.  Return precautions given.  Final Clinical Impressions(s) /  ED Diagnoses   Final diagnoses:  Left upper quadrant pain  Hypokalemia    ED Discharge Orders          Ordered    pantoprazole (PROTONIX) 20 MG tablet  Daily     02/14/18 2233    ondansetron (ZOFRAN) 4 MG tablet  Every 6 hours PRN     02/14/18 2233    dicyclomine (BENTYL) 20 MG tablet  2 times daily PRN     02/14/18 2233    potassium chloride SA (K-DUR,KLOR-CON) 20 MEQ tablet  Daily     02/14/18 2233    sucralfate (CARAFATE) 1 g tablet  3 times daily with meals & bedtime     02/14/18 2233           Loren RacerYelverton, Cong Hightower, MD 02/14/18 2236

## 2018-10-04 ENCOUNTER — Encounter (HOSPITAL_BASED_OUTPATIENT_CLINIC_OR_DEPARTMENT_OTHER): Payer: Self-pay | Admitting: *Deleted

## 2018-10-04 ENCOUNTER — Other Ambulatory Visit: Payer: Self-pay

## 2018-10-06 ENCOUNTER — Other Ambulatory Visit: Payer: Self-pay | Admitting: Orthopedic Surgery

## 2018-10-06 ENCOUNTER — Other Ambulatory Visit: Payer: Self-pay

## 2018-10-06 ENCOUNTER — Other Ambulatory Visit (HOSPITAL_COMMUNITY)
Admission: RE | Admit: 2018-10-06 | Discharge: 2018-10-06 | Disposition: A | Discharge status: Home / Self Care | Payer: Medicaid Other | Source: Ambulatory Visit | Attending: Orthopedic Surgery | Admitting: Orthopedic Surgery

## 2018-10-06 ENCOUNTER — Encounter (HOSPITAL_BASED_OUTPATIENT_CLINIC_OR_DEPARTMENT_OTHER)
Admission: RE | Admit: 2018-10-06 | Discharge: 2018-10-06 | Disposition: A | Discharge status: Home / Self Care | Payer: Medicaid Other | Source: Ambulatory Visit | Attending: Orthopedic Surgery | Admitting: Orthopedic Surgery

## 2018-10-06 DIAGNOSIS — R001 Bradycardia, unspecified: Secondary | ICD-10-CM | POA: Insufficient documentation

## 2018-10-06 DIAGNOSIS — I10 Essential (primary) hypertension: Secondary | ICD-10-CM | POA: Insufficient documentation

## 2018-10-06 DIAGNOSIS — Z20828 Contact with and (suspected) exposure to other viral communicable diseases: Secondary | ICD-10-CM | POA: Diagnosis not present

## 2018-10-06 DIAGNOSIS — Z01818 Encounter for other preprocedural examination: Secondary | ICD-10-CM | POA: Diagnosis present

## 2018-10-06 LAB — BASIC METABOLIC PANEL
Anion gap: 10 (ref 5–15)
BUN: 20 mg/dL (ref 6–20)
CO2: 25 mmol/L (ref 22–32)
Calcium: 8.7 mg/dL — ABNORMAL LOW (ref 8.9–10.3)
Chloride: 106 mmol/L (ref 98–111)
Creatinine, Ser: 1.01 mg/dL — ABNORMAL HIGH (ref 0.44–1.00)
GFR calc Af Amer: >60 mL/min (ref >60)
GFR calc non Af Amer: >60 mL/min (ref >60)
Glucose, Bld: 94 mg/dL (ref 70–99)
Potassium: 3.4 mmol/L — ABNORMAL LOW (ref 3.5–5.1)
Sodium: 141 mmol/L (ref 135–145)

## 2018-10-06 NOTE — Progress Notes (Signed)
Labs reviewed with Dr. Smith Robert. Ok to proceed with surgery as scheduled.

## 2018-10-07 LAB — NOVEL CORONAVIRUS, NAA (HOSP ORDER, SEND-OUT TO REF LAB; TAT 18-24 HRS): SARS-CoV-2, NAA: NOT DETECTED

## 2018-10-10 ENCOUNTER — Ambulatory Visit (HOSPITAL_BASED_OUTPATIENT_CLINIC_OR_DEPARTMENT_OTHER): Payer: Medicaid Other | Admitting: Anesthesiology

## 2018-10-10 ENCOUNTER — Ambulatory Visit (HOSPITAL_BASED_OUTPATIENT_CLINIC_OR_DEPARTMENT_OTHER)
Admission: RE | Admit: 2018-10-10 | Discharge: 2018-10-10 | Disposition: A | Discharge status: Home / Self Care | Payer: Medicaid Other | Attending: Orthopedic Surgery | Admitting: Orthopedic Surgery

## 2018-10-10 ENCOUNTER — Other Ambulatory Visit: Payer: Self-pay

## 2018-10-10 ENCOUNTER — Encounter (HOSPITAL_BASED_OUTPATIENT_CLINIC_OR_DEPARTMENT_OTHER)
Admission: RE | Disposition: A | Discharge status: Home / Self Care | Payer: Self-pay | Source: Home / Self Care | Attending: Orthopedic Surgery

## 2018-10-10 DIAGNOSIS — K219 Gastro-esophageal reflux disease without esophagitis: Secondary | ICD-10-CM | POA: Diagnosis not present

## 2018-10-10 DIAGNOSIS — M19111 Post-traumatic osteoarthritis, right shoulder: Secondary | ICD-10-CM | POA: Insufficient documentation

## 2018-10-10 DIAGNOSIS — M1990 Unspecified osteoarthritis, unspecified site: Secondary | ICD-10-CM | POA: Insufficient documentation

## 2018-10-10 DIAGNOSIS — S46011A Strain of muscle(s) and tendon(s) of the rotator cuff of right shoulder, initial encounter: Secondary | ICD-10-CM | POA: Insufficient documentation

## 2018-10-10 DIAGNOSIS — S43431A Superior glenoid labrum lesion of right shoulder, initial encounter: Secondary | ICD-10-CM | POA: Diagnosis not present

## 2018-10-10 DIAGNOSIS — Z791 Long term (current) use of non-steroidal anti-inflammatories (NSAID): Secondary | ICD-10-CM | POA: Insufficient documentation

## 2018-10-10 DIAGNOSIS — M797 Fibromyalgia: Secondary | ICD-10-CM | POA: Diagnosis not present

## 2018-10-10 DIAGNOSIS — Z87891 Personal history of nicotine dependence: Secondary | ICD-10-CM | POA: Diagnosis not present

## 2018-10-10 DIAGNOSIS — I10 Essential (primary) hypertension: Secondary | ICD-10-CM | POA: Insufficient documentation

## 2018-10-10 DIAGNOSIS — Z79899 Other long term (current) drug therapy: Secondary | ICD-10-CM | POA: Insufficient documentation

## 2018-10-10 DIAGNOSIS — W19XXXA Unspecified fall, initial encounter: Secondary | ICD-10-CM | POA: Diagnosis not present

## 2018-10-10 HISTORY — PX: SHOULDER ARTHROSCOPY WITH ROTATOR CUFF REPAIR: SHX5685

## 2018-10-10 HISTORY — DX: Gastro-esophageal reflux disease without esophagitis: K21.9

## 2018-10-10 HISTORY — PX: SHOULDER ARTHROSCOPY WITH SUBACROMIAL DECOMPRESSION: SHX5684

## 2018-10-10 SURGERY — ARTHROSCOPY, SHOULDER, WITH ROTATOR CUFF REPAIR
Anesthesia: General | Site: Shoulder | Laterality: Right

## 2018-10-10 MED ORDER — SUGAMMADEX SODIUM 500 MG/5ML IV SOLN
INTRAVENOUS | Status: AC
  Filled 2018-10-10: qty 5

## 2018-10-10 MED ORDER — LACTATED RINGERS IV SOLN
INTRAVENOUS | Status: DC
  Administered 2018-10-10: 13:00:00 via INTRAVENOUS

## 2018-10-10 MED ORDER — CEFAZOLIN SODIUM-DEXTROSE 2-4 GM/100ML-% IV SOLN
INTRAVENOUS | Status: AC
  Filled 2018-10-10: qty 100

## 2018-10-10 MED ORDER — ROCURONIUM BROMIDE 10 MG/ML (PF) SYRINGE
PREFILLED_SYRINGE | INTRAVENOUS | Status: AC
  Filled 2018-10-10: qty 10

## 2018-10-10 MED ORDER — EPHEDRINE SULFATE 50 MG/ML IJ SOLN
INTRAMUSCULAR | Status: DC | PRN
  Administered 2018-10-10 (×4): 10 mg via INTRAVENOUS
  Administered 2018-10-10: 5 mg via INTRAVENOUS

## 2018-10-10 MED ORDER — SCOPOLAMINE 1 MG/3DAYS TD PT72
MEDICATED_PATCH | TRANSDERMAL | Status: AC
  Filled 2018-10-10: qty 1

## 2018-10-10 MED ORDER — FENTANYL CITRATE (PF) 100 MCG/2ML IJ SOLN
INTRAMUSCULAR | Status: AC
  Filled 2018-10-10: qty 2

## 2018-10-10 MED ORDER — ONDANSETRON HCL 4 MG/2ML IJ SOLN
INTRAMUSCULAR | Status: AC
  Filled 2018-10-10: qty 2

## 2018-10-10 MED ORDER — PROPOFOL 10 MG/ML IV BOLUS
INTRAVENOUS | Status: DC | PRN
  Administered 2018-10-10: 30 mg via INTRAVENOUS
  Administered 2018-10-10: 170 mg via INTRAVENOUS

## 2018-10-10 MED ORDER — ROCURONIUM BROMIDE 100 MG/10ML IV SOLN
INTRAVENOUS | Status: DC | PRN
  Administered 2018-10-10: 60 mg via INTRAVENOUS

## 2018-10-10 MED ORDER — MIDAZOLAM HCL 2 MG/2ML IJ SOLN
INTRAMUSCULAR | Status: AC
  Filled 2018-10-10: qty 2

## 2018-10-10 MED ORDER — EPHEDRINE 5 MG/ML INJ
INTRAVENOUS | Status: AC
  Filled 2018-10-10: qty 20

## 2018-10-10 MED ORDER — SUCCINYLCHOLINE CHLORIDE 200 MG/10ML IV SOSY
PREFILLED_SYRINGE | INTRAVENOUS | Status: AC
  Filled 2018-10-10: qty 10

## 2018-10-10 MED ORDER — MIDAZOLAM HCL 2 MG/2ML IJ SOLN
1.0000 mg | INTRAMUSCULAR | Status: DC | PRN
  Administered 2018-10-10: 2 mg via INTRAVENOUS

## 2018-10-10 MED ORDER — CEFAZOLIN SODIUM-DEXTROSE 2-4 GM/100ML-% IV SOLN
2.0000 g | INTRAVENOUS | Status: AC
  Administered 2018-10-10: 15:00:00 2 g via INTRAVENOUS

## 2018-10-10 MED ORDER — SODIUM CHLORIDE 0.9 % IR SOLN
Status: DC | PRN
  Administered 2018-10-10: 9000 mL

## 2018-10-10 MED ORDER — PROPOFOL 10 MG/ML IV BOLUS
INTRAVENOUS | Status: AC
  Filled 2018-10-10: qty 20

## 2018-10-10 MED ORDER — CELECOXIB 400 MG PO CAPS
400.0000 mg | ORAL_CAPSULE | Freq: Once | ORAL | Status: AC
  Administered 2018-10-10: 400 mg via ORAL

## 2018-10-10 MED ORDER — SODIUM CHLORIDE 0.9 % IV SOLN
INTRAVENOUS | Status: DC | PRN
  Administered 2018-10-10: 50 ug/min via INTRAVENOUS

## 2018-10-10 MED ORDER — BUPIVACAINE HCL (PF) 0.5 % IJ SOLN
INTRAMUSCULAR | Status: DC | PRN
  Administered 2018-10-10: 15 mL via PERINEURAL

## 2018-10-10 MED ORDER — GLYCOPYRROLATE PF 0.2 MG/ML IJ SOSY
PREFILLED_SYRINGE | INTRAMUSCULAR | Status: AC
  Filled 2018-10-10: qty 1

## 2018-10-10 MED ORDER — ACETAMINOPHEN 500 MG PO TABS
1000.0000 mg | ORAL_TABLET | Freq: Once | ORAL | Status: AC
  Administered 2018-10-10: 12:00:00 1000 mg via ORAL

## 2018-10-10 MED ORDER — CHLORHEXIDINE GLUCONATE 4 % EX LIQD: 60.0000 mL | Freq: Once | CUTANEOUS | Status: DC

## 2018-10-10 MED ORDER — DEXAMETHASONE SODIUM PHOSPHATE 10 MG/ML IJ SOLN
INTRAMUSCULAR | Status: AC
  Filled 2018-10-10: qty 1

## 2018-10-10 MED ORDER — PROMETHAZINE HCL 25 MG/ML IJ SOLN: 6.2500 mg | INTRAMUSCULAR | Status: DC | PRN

## 2018-10-10 MED ORDER — TIZANIDINE HCL 4 MG PO TABS: 4.0000 mg | ORAL_TABLET | Freq: Three times a day (TID) | ORAL | 1 refills | Status: AC | PRN

## 2018-10-10 MED ORDER — DEXAMETHASONE SODIUM PHOSPHATE 10 MG/ML IJ SOLN
INTRAMUSCULAR | Status: DC | PRN
  Administered 2018-10-10: 4 mg via INTRAVENOUS

## 2018-10-10 MED ORDER — SCOPOLAMINE 1 MG/3DAYS TD PT72: 1.0000 | MEDICATED_PATCH | TRANSDERMAL | Status: DC

## 2018-10-10 MED ORDER — GLYCOPYRROLATE 0.2 MG/ML IJ SOLN
INTRAMUSCULAR | Status: DC | PRN
  Administered 2018-10-10: 0.2 mg via INTRAVENOUS

## 2018-10-10 MED ORDER — LIDOCAINE 2% (20 MG/ML) 5 ML SYRINGE
INTRAMUSCULAR | Status: AC
  Filled 2018-10-10: qty 5

## 2018-10-10 MED ORDER — SCOPOLAMINE 1 MG/3DAYS TD PT72
1.0000 | MEDICATED_PATCH | Freq: Once | TRANSDERMAL | Status: DC
  Administered 2018-10-10: 1.5 mg via TRANSDERMAL

## 2018-10-10 MED ORDER — ACETAMINOPHEN 500 MG PO TABS
ORAL_TABLET | ORAL | Status: AC
  Filled 2018-10-10: qty 2

## 2018-10-10 MED ORDER — FENTANYL CITRATE (PF) 100 MCG/2ML IJ SOLN
50.0000 ug | INTRAMUSCULAR | Status: AC | PRN
  Administered 2018-10-10: 50 ug via INTRAVENOUS
  Administered 2018-10-10: 100 ug via INTRAVENOUS
  Administered 2018-10-10: 50 ug via INTRAVENOUS

## 2018-10-10 MED ORDER — OXYCODONE-ACETAMINOPHEN 5-325 MG PO TABS: ORAL_TABLET | ORAL | 0 refills | Status: AC

## 2018-10-10 MED ORDER — PHENYLEPHRINE 40 MCG/ML (10ML) SYRINGE FOR IV PUSH (FOR BLOOD PRESSURE SUPPORT)
PREFILLED_SYRINGE | INTRAVENOUS | Status: AC
  Filled 2018-10-10: qty 20

## 2018-10-10 MED ORDER — BUPIVACAINE LIPOSOME 1.3 % IJ SUSP
INTRAMUSCULAR | Status: DC | PRN
  Administered 2018-10-10: 10 mL via PERINEURAL

## 2018-10-10 MED ORDER — SUGAMMADEX SODIUM 200 MG/2ML IV SOLN
INTRAVENOUS | Status: DC | PRN
  Administered 2018-10-10: 300 mg via INTRAVENOUS

## 2018-10-10 MED ORDER — PHENYLEPHRINE HCL (PRESSORS) 10 MG/ML IV SOLN
INTRAVENOUS | Status: DC | PRN
  Administered 2018-10-10: 40 ug via INTRAVENOUS
  Administered 2018-10-10: 80 ug via INTRAVENOUS
  Administered 2018-10-10: 40 ug via INTRAVENOUS

## 2018-10-10 MED ORDER — CELECOXIB 200 MG PO CAPS
ORAL_CAPSULE | ORAL | Status: AC
  Filled 2018-10-10: qty 2

## 2018-10-10 MED ORDER — POVIDONE-IODINE 10 % EX SWAB
2.0000 "application " | Freq: Once | CUTANEOUS | Status: AC
  Administered 2018-10-10: 2 via TOPICAL

## 2018-10-10 MED ORDER — FENTANYL CITRATE (PF) 100 MCG/2ML IJ SOLN: 25.0000 ug | INTRAMUSCULAR | Status: DC | PRN

## 2018-10-10 SURGICAL SUPPLY — 71 items
ADH SKN CLS APL DERMABOND .7 (GAUZE/BANDAGES/DRESSINGS)
AID PSTN UNV HD RSTRNT DISP (MISCELLANEOUS) ×1
ANCH SUT SWLK 19.1X4.75 VT (Anchor) ×2 IMPLANT
ANCHOR PEEK 4.75X19.1 SWLK C (Anchor) ×4 IMPLANT
APL PRP STRL LF DISP 70% ISPRP (MISCELLANEOUS) ×1
BLADE SHAVER BONE 5.0MM X 13CM (MISCELLANEOUS) ×1
BLADE SHAVER BONE 5.0X13 (MISCELLANEOUS) ×2 IMPLANT
BURR OVAL 8 FLU 4.0MM X 13CM (MISCELLANEOUS) ×1
BURR OVAL 8 FLU 4.0X13 (MISCELLANEOUS) ×2 IMPLANT
CANNULA 5.75X71 LONG (CANNULA) ×3 IMPLANT
CANNULA TWIST IN 8.25X7CM (CANNULA) ×2 IMPLANT
CHLORAPREP W/TINT 26 (MISCELLANEOUS) ×3 IMPLANT
COVER WAND RF STERILE (DRAPES) IMPLANT
CUTTER BONE 4.0MM X 13CM (MISCELLANEOUS) ×2 IMPLANT
DECANTER SPIKE VIAL GLASS SM (MISCELLANEOUS) IMPLANT
DERMABOND ADVANCED (GAUZE/BANDAGES/DRESSINGS)
DERMABOND ADVANCED .7 DNX12 (GAUZE/BANDAGES/DRESSINGS) IMPLANT
DRAPE IMP U-DRAPE 54X76 (DRAPES) ×3 IMPLANT
DRAPE INCISE IOBAN 66X45 STRL (DRAPES) ×3 IMPLANT
DRAPE STERI 35X30 U-POUCH (DRAPES) ×3 IMPLANT
DRAPE SURG 17X23 STRL (DRAPES) ×3 IMPLANT
DRAPE U-SHAPE 47X51 STRL (DRAPES) ×3 IMPLANT
DRAPE U-SHAPE 76X120 STRL (DRAPES) ×6 IMPLANT
DRSG PAD ABDOMINAL 8X10 ST (GAUZE/BANDAGES/DRESSINGS) ×3 IMPLANT
ELECT REM PT RETURN 9FT ADLT (ELECTROSURGICAL) ×3
ELECTRODE REM PT RTRN 9FT ADLT (ELECTROSURGICAL) ×1 IMPLANT
GAUZE 4X4 16PLY RFD (DISPOSABLE) IMPLANT
GAUZE SPONGE 4X4 12PLY STRL (GAUZE/BANDAGES/DRESSINGS) ×3 IMPLANT
GAUZE XEROFORM 1X8 LF (GAUZE/BANDAGES/DRESSINGS) ×3 IMPLANT
GLOVE BIO SURGEON STRL SZ7 (GLOVE) ×3 IMPLANT
GLOVE BIO SURGEON STRL SZ7.5 (GLOVE) ×3 IMPLANT
GLOVE BIOGEL PI IND STRL 7.0 (GLOVE) ×1 IMPLANT
GLOVE BIOGEL PI IND STRL 8 (GLOVE) ×1 IMPLANT
GLOVE BIOGEL PI INDICATOR 7.0 (GLOVE) ×2
GLOVE BIOGEL PI INDICATOR 8 (GLOVE) ×2
GOWN STRL REUS W/ TWL LRG LVL3 (GOWN DISPOSABLE) ×2 IMPLANT
GOWN STRL REUS W/ TWL XL LVL3 (GOWN DISPOSABLE) ×1 IMPLANT
GOWN STRL REUS W/TWL LRG LVL3 (GOWN DISPOSABLE) ×9
GOWN STRL REUS W/TWL XL LVL3 (GOWN DISPOSABLE) ×6
LASSO CRESCENT QUICKPASS (SUTURE) IMPLANT
MANIFOLD NEPTUNE II (INSTRUMENTS) ×3 IMPLANT
NDL SCORPION MULTI FIRE (NEEDLE) IMPLANT
NEEDLE SCORPION MULTI FIRE (NEEDLE) ×3 IMPLANT
NS IRRIG 1000ML POUR BTL (IV SOLUTION) IMPLANT
PACK ARTHROSCOPY DSU (CUSTOM PROCEDURE TRAY) ×3 IMPLANT
PACK BASIN DAY SURGERY FS (CUSTOM PROCEDURE TRAY) ×3 IMPLANT
PENCIL BUTTON HOLSTER BLD 10FT (ELECTRODE) IMPLANT
PROBE BIPOLAR ATHRO 135MM 90D (MISCELLANEOUS) ×3 IMPLANT
RESTRAINT HEAD UNIVERSAL NS (MISCELLANEOUS) ×3 IMPLANT
SLEEVE SCD COMPRESS KNEE MED (MISCELLANEOUS) ×3 IMPLANT
SLING ARM FOAM STRAP LRG (SOFTGOODS) IMPLANT
SLING ARM IMMOBILIZER LRG (SOFTGOODS) ×2 IMPLANT
SPONGE LAP 4X18 RFD (DISPOSABLE) IMPLANT
SUCTION FRAZIER HANDLE 10FR (MISCELLANEOUS)
SUCTION TUBE FRAZIER 10FR DISP (MISCELLANEOUS) IMPLANT
SUPPORT WRAP ARM LG (MISCELLANEOUS) ×2 IMPLANT
SUT BONE WAX W31G (SUTURE) IMPLANT
SUT ETHILON 3 0 PS 1 (SUTURE) ×3 IMPLANT
SUT FIBERWIRE #2 38 T-5 BLUE (SUTURE)
SUT PDS AB 0 CT 36 (SUTURE) IMPLANT
SUT TIGER TAPE 7 IN WHITE (SUTURE) IMPLANT
SUTURE FIBERWR #2 38 T-5 BLUE (SUTURE) IMPLANT
SUTURE TAPE 1.3 40 TPR END (SUTURE) IMPLANT
SUTURETAPE 1.3 40 TPR END (SUTURE) ×6
SYR BULB 3OZ (MISCELLANEOUS) IMPLANT
TAPE FIBER 2MM 7IN #2 BLUE (SUTURE) IMPLANT
TOWEL GREEN STERILE FF (TOWEL DISPOSABLE) ×3 IMPLANT
TUBE CONNECTING 20'X1/4 (TUBING) ×1
TUBE CONNECTING 20X1/4 (TUBING) ×2 IMPLANT
TUBING ARTHROSCOPY IRRIG 16FT (MISCELLANEOUS) ×3 IMPLANT
WATER STERILE IRR 1000ML POUR (IV SOLUTION) ×3 IMPLANT

## 2018-10-10 NOTE — Discharge Instructions (Signed)
Discharge Instructions after Arthroscopic Shoulder Repair   A sling has been provided for you. Remain in your sling at all times. This includes sleeping in your sling.  Use ice on the shoulder intermittently over the first 48 hours after surgery.  Pain medicine has been prescribed for you.  Use your medicine liberally over the first 48 hours, and then you can begin to taper your use. You may take Extra Strength Tylenol or Tylenol only in place of the pain pills. DO NOT take ANY nonsteroidal anti-inflammatory pain medications: Advil, Motrin, Ibuprofen, Aleve, Naproxen, or Narprosyn.  You may remove your dressing after two days. If the incision sites are still moist, place a Band-Aid over the moist site(s). Change Band-Aids daily until dry.  You may shower 5 days after surgery. The incisions CANNOT get wet prior to 5 days. Simply allow the water to wash over the site and then pat dry. Do not rub the incisions. Make sure your axilla (armpit) is completely dry after showering.  Take one aspirin a day for 2 weeks after surgery, unless you have an aspirin sensitivity/ allergy or asthma.   Please call [phone] during normal business hours or [phone] after hours for any problems. Including the following:  - excessive redness of the incisions - drainage for more than 4 days - fever of more than 101.5 F  *Please note that pain medications will not be refilled after hours or on weekends.   Post Anesthesia Home Care Instructions  Activity: Get plenty of rest for the remainder of the day. A responsible individual must stay with you for 24 hours following the procedure.  For the next 24 hours, DO NOT: -Drive a car -Operate machinery -Drink alcoholic beverages -Take any medication unless instructed by your physician -Make any legal decisions or sign important papers.  Meals: Start with liquid foods such as gelatin or soup. Progress to regular foods as tolerated. Avoid greasy, spicy, heavy  foods. If nausea and/or vomiting occur, drink only clear liquids until the nausea and/or vomiting subsides. Call your physician if vomiting continues.  Special Instructions/Symptoms: Your throat may feel dry or sore from the anesthesia or the breathing tube placed in your throat during surgery. If this causes discomfort, gargle with warm salt water. The discomfort should disappear within 24 hours.  If you had a scopolamine patch placed behind your ear for the management of post- operative nausea and/or vomiting:  1. The medication in the patch is effective for 72 hours, after which it should be removed.  Wrap patch in a tissue and discard in the trash. Wash hands thoroughly with soap and water. 2. You may remove the patch earlier than 72 hours if you experience unpleasant side effects which may include dry mouth, dizziness or visual disturbances. 3. Avoid touching the patch. Wash your hands with soap and water after contact with the patch.    Information for Discharge Teaching: EXPAREL (bupivacaine liposome injectable suspension)   Your surgeon or anesthesiologist gave you EXPAREL(bupivacaine) to help control your pain after surgery.   EXPAREL is a local anesthetic that provides pain relief by numbing the tissue around the surgical site.  EXPAREL is designed to release pain medication over time and can control pain for up to 72 hours.  Depending on how you respond to EXPAREL, you may require less pain medication during your recovery.  Possible side effects:  Temporary loss of sensation or ability to move in the area where bupivacaine was injected.  Nausea, vomiting, constipation    Rarely, numbness and tingling in your mouth or lips, lightheadedness, or anxiety may occur.  Call your doctor right away if you think you may be experiencing any of these sensations, or if you have other questions regarding possible side effects.  Follow all other discharge instructions given to you by your  surgeon or nurse. Eat a healthy diet and drink plenty of water or other fluids.  If you return to the hospital for any reason within 96 hours following the administration of EXPAREL, it is important for health care providers to know that you have received this anesthetic. A teal colored band has been placed on your arm with the date, time and amount of EXPAREL you have received in order to alert and inform your health care providers. Please leave this armband in place for the full 96 hours following administration, and then you may remove the band.Regional Anesthesia Blocks  1. Numbness or the inability to move the "blocked" extremity may last from 3-48 hours after placement. The length of time depends on the medication injected and your individual response to the medication. If the numbness is not going away after 48 hours, call your surgeon.  2. The extremity that is blocked will need to be protected until the numbness is gone and the  Strength has returned. Because you cannot feel it, you will need to take extra care to avoid injury. Because it may be weak, you may have difficulty moving it or using it. You may not know what position it is in without looking at it while the block is in effect.  3. For blocks in the legs and feet, returning to weight bearing and walking needs to be done carefully. You will need to wait until the numbness is entirely gone and the strength has returned. You should be able to move your leg and foot normally before you try and bear weight or walk. You will need someone to be with you when you first try to ensure you do not fall and possibly risk injury.  4. Bruising and tenderness at the needle site are common side effects and will resolve in a few days.  5. Persistent numbness or new problems with movement should be communicated to the surgeon or the [facility] ([phone])/ [facility] ([phone]).   

## 2018-10-10 NOTE — Op Note (Signed)
Procedure(s): SHOULDER ARTHROSCOPY WITH ROTATOR CUFF REPAIR SHOULDER ARTHROSCOPY WITH SUBACROMIAL DECOMPRESSION; DISTAL CLAVICLE EXCISION Procedure Note  [name] [name] female 58 y.o. 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  Postoperative diagnosis: Same Right shoulder type II SLAP tear with proximal long head biceps tendinopathy  Procedure(s) and Anesthesia Type:    *RIGHT SHOULDER ARTHROSCOPY WITH ROTATOR CUFF REPAIR - General    * SHOULDER ARTHROSCOPY WITH SUBACROMIAL DECOMPRESSION;  DISTAL CLAVICLE EXCISION - General Right shoulder proximal long head biceps tenotomy  Surgeon(s) and Role:    Tania Ade, MD - Primary     Surgeon: Isabella Stalling   Assistants: Jeanmarie Hubert PA-C (Danielle was present and scrubbed throughout the procedure and was essential in positioning, assisting with the camera and instrumentation,, and closure)  Anesthesia: General endotracheal anesthesia with preoperative interscalene block given by the attending anesthesiologist     Procedure Detail    Estimated Blood Loss: Min         Drains: none  Blood Given: none         Specimens: none        Complications:  * No complications entered in OR log *         Disposition: PACU - hemodynamically stable.         Condition: stable    Procedure:   INDICATIONS FOR SURGERY: The patient is 58 y.o. female who fell in a parking lot approximately 5 months ago.  She has had continued pain and weakness with failure of conservative management.  MRI showed full-thickness rotator cuff tearing.  OPERATIVE FINDINGS: Examination under anesthesia: No significant stiffness or instability   DESCRIPTION OF PROCEDURE: The patient was identified in preoperative  holding area where I personally marked the operative site after  verifying site,  side, and procedure with the patient. An interscalene block was given by the attending anesthesiologist the holding area.  The patient was taken back to the operating room where general anesthesia was induced without complication and was placed in the beach-chair position with the back  elevated about 60 degrees and all extremities and head and neck carefully padded and  positioned.   The right upper extremity was then prepped and  draped in a standard sterile fashion. The appropriate time-out  procedure was carried out. The patient did receive IV antibiotics  within 30 minutes of incision.   A small posterior portal incision was made and the arthroscope was introduced into the joint. An anterior portal was then established above the subscapularis using needle localization. Small cannula was placed anteriorly. Diagnostic arthroscopy was then carried out.  The subscapularis was noted to be intact with mild articular sided fraying which was debrided.  Superior labrum was noted to be completely detached with a type II SLAP tear extending into the origin of the biceps tendon and involving up to 50% of the thickness.  I felt that this would remain a pain generator despite debridement if left alone and therefore through a small anterior incision curved Mayo scissors was used to release the biceps from the superior labrum.  It was allowed to retract from the joint. The undersurface of the supraspinatus and infraspinatus were carefully examined and there was some thinning but no definite full-thickness tearing from the undersurface.  The arthroscope was then introduced into the subacromial space a standard lateral portal was established with needle localization. The shaver was  used through the lateral portal to perform extensive bursectomy. Coracoacromial ligament was examined and found to be frayed and prominent indicating chronic impingement.  The bursal surface of the supraspinatus was noted to be about  90% torn with only a few medial strands intact.  The shaver was used to debride the torn edge of the tendon back to a more healthy-appearing tendon although the edge was quite ragged and irregular.  Once the tear was completed the tuberosity was debrided down to a bleeding surface to promote healing.  The repair was then carried out by placing a 4.75 peek swivel lock anchor percutaneously just off the lateral margin of the acromion centrally in the tear.  It was preloaded with 2 suture tapes.  These 4 strands were then passed evenly throughout the tear anterior to posterior and brought over to an additional 4.75 peek swivel lock anchor in a lateral row bringing the tendon down back over the prepared tuberosity.  There was no significant tension on the repair.  The coracoacromial ligament was taken down off the anterior acromion with the ArthroCare exposing a large hooked anterior lateral acromial spur. A high-speed bur was then used through the lateral portal to take down the anterior acromial spur from lateral to medial in a standard acromioplasty.  The acromioplasty was also viewed from the lateral portal and the bur was used as necessary to ensure that the acromion was completely flat from posterior to anterior.  The distal clavicle was exposed arthroscopically and the bur was used to take off the undersurface for approximately 8 mm from the lateral portal. The bur was then moved to an anterior portal position to complete the distal clavicle excision resecting about 8 mm of the distal clavicle and a smooth even fashion. This was viewed from anterior and lateral portals and felt to be complete.  The arthroscopic equipment was removed from the joint and the portals were closed with 3-0 nylon in an interrupted fashion. Sterile dressings were then applied including Xeroform 4 x 4's ABDs and tape. The patient was then allowed to awaken from general anesthesia, placed in a sling, transferred to the stretcher and  taken to the recovery room in stable condition.   POSTOPERATIVE PLAN: The patient will be discharged home today and will followup in one week for suture removal and wound check.  She will follow the standard cuff protocol.

## 2018-10-10 NOTE — Transfer of Care (Signed)
Immediate Anesthesia Transfer of Care Note  Patient: [name] [name]  Procedure(s) Performed: SHOULDER ARTHROSCOPY WITH ROTATOR CUFF REPAIR (Right Shoulder) SHOULDER ARTHROSCOPY WITH SUBACROMIAL DECOMPRESSION; DISTAL CLAVICLE EXCISION (Right Shoulder)  Patient Location: PACU  Anesthesia Type:GA combined with regional for post-op pain  Level of Consciousness: sedated and responds to stimulation  Airway & Oxygen Therapy: Patient Spontanous Breathing and Patient connected to face mask oxygen  Post-op Assessment: Report given to RN and Post -op Vital signs reviewed and stable  Post vital signs: Reviewed and stable  Last Vitals:  Vitals Value Taken Time  BP 139/77 10/10/18 1600  Temp    Pulse 88 10/10/18 1603  Resp 10 10/10/18 1603  SpO2 95 % 10/10/18 1603  Vitals shown include unvalidated device data.  Last Pain:  Vitals:   10/10/18 1211  TempSrc: Oral  PainSc: 10-Worst pain ever      Patients Stated Pain Goal: 4 (28/63/81 [id number])  Complications: No apparent anesthesia complications

## 2018-10-10 NOTE — Anesthesia Preprocedure Evaluation (Addendum)
Anesthesia Evaluation  Patient identified by MRN, date of birth, ID band Patient awake    Reviewed: Allergy & Precautions, NPO status , Patient's Chart, lab work & pertinent test results  History of Anesthesia Complications Negative for: history of anesthetic complications  Airway Mallampati: II  TM Distance: >3 FB Neck ROM: Full    Dental no notable dental hx. (+) Dental Advisory Given   Pulmonary neg pulmonary ROS, former smoker,    Pulmonary exam normal        Cardiovascular hypertension, Pt. on medications Normal cardiovascular exam     Neuro/Psych negative neurological ROS     GI/Hepatic Neg liver ROS, GERD  Medicated,  Endo/Other  negative endocrine ROS  Renal/GU negative Renal ROS     Musculoskeletal  (+) Fibromyalgia -, narcotic dependent  Abdominal   Peds  Hematology negative hematology ROS (+)   Anesthesia Other Findings Day of surgery medications reviewed with the patient.  Reproductive/Obstetrics                            Anesthesia Physical Anesthesia Plan  ASA: II  Anesthesia Plan: General   Post-op Pain Management:  Regional for Post-op pain   Induction: Intravenous  PONV Risk Score and Plan: 3 and Ondansetron, Dexamethasone and Scopolamine patch - Pre-op  Airway Management Planned: Oral ETT  Additional Equipment:   Intra-op Plan:   Post-operative Plan: Extubation in OR  Informed Consent: I have reviewed the patients History and Physical, chart, labs and discussed the procedure including the risks, benefits and alternatives for the proposed anesthesia with the patient or authorized representative who has indicated his/her understanding and acceptance.     Dental advisory given  Plan Discussed with: CRNA, Anesthesiologist and Surgeon  Anesthesia Plan Comments:        Anesthesia Quick Evaluation

## 2018-10-10 NOTE — Anesthesia Procedure Notes (Signed)
Procedure Name: Intubation Date/Time: 10/10/2018 2:28 PM Performed by: Lavonia Dana, CRNA Pre-anesthesia Checklist: Patient identified, Emergency Drugs available, Suction available and Patient being monitored Patient Re-evaluated:Patient Re-evaluated prior to induction Oxygen Delivery Method: Circle system utilized Preoxygenation: Pre-oxygenation with 100% oxygen Induction Type: IV induction Ventilation: Mask ventilation without difficulty Laryngoscope Size: Miller and 2 Grade View: Grade II Tube type: Oral Tube size: 7.0 mm Number of attempts: 1 Airway Equipment and Method: Stylet and Oral airway Placement Confirmation: ETT inserted through vocal cords under direct vision,  positive ETCO2 and breath sounds checked- equal and bilateral Tube secured with: Tape Dental Injury: Teeth and Oropharynx as per pre-operative assessment  Comments: Grade IIa view

## 2018-10-10 NOTE — Progress Notes (Signed)
Assisted Dr. Tobias Alexander with right, infraclavicular block. Side rails up, monitors on throughout procedure. See vital signs in flow sheet. Tolerated Procedure well.

## 2018-10-10 NOTE — H&P (Signed)
[name] [name] is an 58 y.o. female.   Chief Complaint: Right shoulder pain and weakness HPI: 58 year old female with right shoulder pain and weakness after a fall onto an outstretched arm approximately 5 months ago.  She was found to have a full-thickness rotator cuff tear.  Indicated for surgical treatment to decrease pain and restore function.  Past Medical History:  Diagnosis Date  . Arthritis   . Chronic back pain   . Fibromyalgia   . GERD (gastroesophageal reflux disease)   . Hypertension     Past Surgical History:  Procedure Laterality Date  . APPENDECTOMY    . CARPAL TUNNEL RELEASE Right   . CHOLECYSTECTOMY    . HEMORRHOID SURGERY    . NECK SURGERY      History reviewed. No pertinent family history. Social History:  reports that she has quit smoking. She has never used smokeless tobacco. She reports current alcohol use. She reports that she does not use drugs.  Allergies: No Known Allergies  Medications Prior to Admission  Medication Sig Dispense Refill  . acetaminophen (TYLENOL) 500 MG tablet Take 1 tablet (500 mg total) by mouth every 6 (six) hours as needed. (Patient taking differently: Take 500 mg by mouth every 6 (six) hours as needed for headache. ) 30 tablet 0  . amLODipine (NORVASC) 5 MG tablet Take 5 mg by mouth daily.  2  . cyclobenzaprine (FLEXERIL) 10 MG tablet Take 1 tablet (10 mg total) by mouth 3 (three) times daily as needed for muscle spasms. 30 tablet 0  . gabapentin (NEURONTIN) 300 MG capsule Take 300 mg by mouth 4 (four) times daily.  2  . hydrochlorothiazide (HYDRODIURIL) 25 MG tablet Take 1 tablet (25 mg total) by mouth daily. 30 tablet 0  . HYDROcodone-acetaminophen (NORCO) 5-325 MG tablet Take 1 tablet by mouth every 6 (six) hours as needed for severe pain. 6 tablet 0  . hydrOXYzine (ATARAX/VISTARIL) 25 MG tablet Take 0.5-1 tablets (12.5-25 mg total) by mouth every 8 (eight) hours as needed for itching. 30 tablet 0  . naproxen (NAPROSYN) 500 MG  tablet Take 1 tablet (500 mg total) by mouth 2 (two) times daily. 30 tablet 0  . ondansetron (ZOFRAN) 4 MG tablet Take 1 tablet (4 mg total) by mouth every 6 (six) hours as needed for nausea or vomiting. 12 tablet 0  . pantoprazole (PROTONIX) 20 MG tablet Take 1 tablet (20 mg total) by mouth daily. 30 tablet 3  . traMADol (ULTRAM) 50 MG tablet Take 1 tablet (50 mg total) by mouth every 6 (six) hours as needed for moderate pain or severe pain. 30 tablet 0    No results found for this or any previous visit (from the past 48 hour(s)). No results found.  Review of Systems  All other systems reviewed and are negative.   Blood pressure (!) 162/94, pulse 73, temperature 99 F (37.2 C), temperature source Oral, resp. rate 15, height 5\' 1"  (1.549 m), weight 78.6 kg, SpO2 100 %. Physical Exam  Constitutional: She is oriented to person, place, and time. She appears well-developed and well-nourished.  HENT:  Head: Atraumatic.  Eyes: EOM are normal.  Cardiovascular: Intact distal pulses.  Respiratory: Effort normal.  Musculoskeletal:     Comments: R shoulder pain and weakness with RC testing.  Neurological: She is alert and oriented to person, place, and time.  Skin: Skin is warm and dry.  Psychiatric: She has a normal mood and affect.     Assessment/Plan 58 year old female  with right shoulder pain and weakness after a fall onto an outstretched arm approximately 5 months ago.  She was found to have a full-thickness rotator cuff tear.  Indicated for surgical treatment to decrease pain and restore function. Plan right shoulder arthroscopic rotator cuff repair, subacromial decompression, distal clavicle excision  Isabella Stalling, MD 10/10/2018, 2:15 PM

## 2018-10-10 NOTE — Anesthesia Procedure Notes (Signed)
Anesthesia Regional Block: Interscalene brachial plexus block   Pre-Anesthetic Checklist: ,, timeout performed, Correct Patient, Correct Site, Correct Laterality, Correct Procedure, Correct Position, site marked, Risks and benefits discussed,  Surgical consent,  Pre-op evaluation,  At surgeon's request and post-op pain management  Laterality: Right  Prep: chloraprep       Needles:  Injection technique: Single-shot  Needle Type: Echogenic Stimulator Needle     Needle Length: 5cm  Needle Gauge: 22     Additional Needles:   Narrative:  Start time: 10/10/2018 12:59 PM End time: 10/10/2018 1:09 PM Injection made incrementally with aspirations every 5 mL.  Performed by: Personally  Anesthesiologist: Duane Boston, MD  Additional Notes: Functioning IV was confirmed and monitors applied.  A 102mm 22ga echogenic arrow stimulator was used. Sterile prep and drape,hand hygiene and sterile gloves were used.Ultrasound guidance: relevant anatomy identified, needle position confirmed, local anesthetic spread visualized around nerve(s)., vascular puncture avoided.  Image printed for medical record.  Negative aspiration and negative test dose prior to incremental administration of local anesthetic. The patient tolerated the procedure well.

## 2018-10-10 NOTE — Anesthesia Postprocedure Evaluation (Signed)
Anesthesia Post Note  Patient: [name] [name]  Procedure(s) Performed: SHOULDER ARTHROSCOPY WITH ROTATOR CUFF REPAIR (Right Shoulder) SHOULDER ARTHROSCOPY WITH SUBACROMIAL DECOMPRESSION; DISTAL CLAVICLE EXCISION (Right Shoulder)     Patient location during evaluation: PACU Anesthesia Type: General Level of consciousness: sedated Pain management: pain level controlled Vital Signs Assessment: post-procedure vital signs reviewed and stable Respiratory status: spontaneous breathing and respiratory function stable Cardiovascular status: stable Postop Assessment: no apparent nausea or vomiting Anesthetic complications: no    Last Vitals:  Vitals:   10/10/18 1615 10/10/18 1630  BP: 135/74 136/70  Pulse: 86 88  Resp: 10 16  Temp:    SpO2: 96% 90%    Last Pain:  Vitals:   10/10/18 1630  TempSrc:   PainSc: 0-No pain                 Shalamar Crays DANIEL

## 2018-10-11 ENCOUNTER — Encounter (HOSPITAL_BASED_OUTPATIENT_CLINIC_OR_DEPARTMENT_OTHER): Payer: Self-pay | Admitting: Orthopedic Surgery

## 2018-12-01 ENCOUNTER — Encounter: Payer: Self-pay | Admitting: Physical Therapy

## 2018-12-01 ENCOUNTER — Other Ambulatory Visit: Payer: Self-pay

## 2018-12-01 ENCOUNTER — Ambulatory Visit: Payer: Medicaid Other | Attending: Orthopedic Surgery | Admitting: Physical Therapy

## 2018-12-01 DIAGNOSIS — M6281 Muscle weakness (generalized): Secondary | ICD-10-CM | POA: Diagnosis present

## 2018-12-01 DIAGNOSIS — R293 Abnormal posture: Secondary | ICD-10-CM

## 2018-12-01 DIAGNOSIS — M25511 Pain in right shoulder: Secondary | ICD-10-CM | POA: Diagnosis present

## 2018-12-01 DIAGNOSIS — M25611 Stiffness of right shoulder, not elsewhere classified: Secondary | ICD-10-CM | POA: Diagnosis present

## 2018-12-01 NOTE — Therapy (Signed)
[facility] [address] [place], [place], [address] Phone: [phone]   Fax:  [phone]  Physical Therapy Evaluation  Patient Details  Name: [name] [name] MRN: [id number] Date of Birth: 1960/02/14 Referring Provider (PT): Dr Jones Broom   Encounter Date: 12/01/2018  PT End of Session - 12/01/18 1420    Visit Number  1    Number of Visits  12    Date for PT Re-Evaluation  01/12/19    Authorization Type  MCD    Authorization Time Period  requesting intial 3 visits.    PT Start Time  1421    PT Stop Time  1453    PT Time Calculation (min)  32 min    Activity Tolerance  Patient tolerated treatment well    Behavior During Therapy  WFL for tasks assessed/performed       Past Medical History:  Diagnosis Date  . Arthritis   . Chronic back pain   . Fibromyalgia   . GERD (gastroesophageal reflux disease)   . Hypertension     Past Surgical History:  Procedure Laterality Date  . APPENDECTOMY    . CARPAL TUNNEL RELEASE Right   . CHOLECYSTECTOMY    . HEMORRHOID SURGERY    . NECK SURGERY    . SHOULDER ARTHROSCOPY WITH ROTATOR CUFF REPAIR Right 10/10/2018   Procedure: SHOULDER ARTHROSCOPY WITH ROTATOR CUFF REPAIR;  Surgeon: Jones Broom, MD;  Location: [facility];  Service: Orthopedics;  Laterality: Right;  . SHOULDER ARTHROSCOPY WITH SUBACROMIAL DECOMPRESSION Right 10/10/2018   Procedure: SHOULDER ARTHROSCOPY WITH SUBACROMIAL DECOMPRESSION; DISTAL CLAVICLE EXCISION;  Surgeon: Jones Broom, MD;  Location: [facility];  Service: Orthopedics;  Laterality: Right;    There were no vitals filed for this visit.   Subjective Assessment - 12/01/18 1421    Subjective  Pt reports she had a tear in her Rt RTC bothering her for a couple yrs.  Had a fall about a year ago and the shoulder got worse. She had elective repair 10/10/2018.  She presents with a sling that she wears when she  goes out.    Patient is accompained by:  Family member   son   Patient Stated Goals  wash her own hair, drive without pain and other normal activities.    Currently in Pain?  Yes    Pain Location  Shoulder    Pain Orientation  Right    Pain Descriptors / Indicators  Dull;Aching;Sharp    Pain Type  Surgical pain    Pain Onset  More than a month ago    Pain Frequency  Intermittent    Aggravating Factors   attempting to move, lying on Rt side    Pain Relieving Factors  roll of the shoulder, ice         [facility] PT Assessment - 12/01/18 0001      Assessment   Medical Diagnosis  Rt RTC repair     Referring Provider (PT)  Dr Jones Broom    Onset Date/Surgical Date  10/10/18    Hand Dominance  Right    Next MD Visit  will schedule - missed 11/4 appointment    Prior Therapy  none, doing gentle motion with her arm and elbow      Precautions   Precautions  Shoulder    Type of Shoulder Precautions  RTC    Shoulder Interventions  Shoulder sling/immobilizer      Balance Screen  Has the patient fallen in the past 6 months  Yes    How many times?  1   cat tripped her after surgery   Has the patient had a decrease in activity level because of a fear of falling?   No    Is the patient reluctant to leave their home because of a fear of falling?   No      Prior Function   Level of Independence  Needs assistance with ADLs;Needs assistance with homemaking    Vocation  On disability    Leisure  bowling if possible      Observation/Other Assessments   Other Surveys   Quick Dash    Quick DASH   77.27% limited      Sensation   Hot/Cold  Appears Intact      Posture/Postural Control   Posture/Postural Control  Postural limitations    Postural Limitations  Rounded Shoulders;Forward head;Increased thoracic kyphosis      ROM / Strength   AROM / PROM / Strength  AROM;PROM      AROM   AROM Assessment Site  Cervical;Shoulder;Elbow    Right/Left Shoulder  Right   LT WNL   Right Shoulder  Extension  57 Degrees    Right Shoulder Flexion  80 Degrees    Right Shoulder ABduction  90 Degrees    Right Shoulder Internal Rotation  50 Degrees    Right Shoulder External Rotation  47 Degrees    Right/Left Elbow  --   WNL   Cervical Flexion  to chest    Cervical Extension  22   pt reports this is baseline since surgery    Cervical - Right Rotation  55   baseline   Cervical - Left Rotation  55   baseline     PROM   PROM Assessment Site  Shoulder    Right/Left Shoulder  Right    Right Shoulder Flexion  124 Degrees    Right Shoulder ABduction  95 Degrees    Right Shoulder Internal Rotation  62 Degrees    Right Shoulder External Rotation  45 Degrees      Flexibility   Soft Tissue Assessment /Muscle Length  --   tightness bilat pecs     Palpation   Palpation comment  tight and tender in Rt deltoid and pecs             Quick Dash - 12/01/18 0001    Open a tight or new jar  Moderate difficulty    Do heavy household chores (wash walls, wash floors)  Unable    Carry a shopping bag or briefcase  Unable    Wash your back  Unable    Use a knife to cut food  Moderate difficulty    Recreational activities in which you take some force or impact through your arm, shoulder, or hand (golf, hammering, tennis)  Unable    During the past week, to what extent has your arm, shoulder or hand problem interfered with your normal social activities with family, friends, neighbors, or groups?  Extremely    During the past week, to what extent has your arm, shoulder or hand problem limited your work or other regular daily activities  Extremely    Arm, shoulder, or hand pain.  Moderate    Tingling (pins and needles) in your arm, shoulder, or hand  None    Difficulty Sleeping  So much difficuSo much difficulty, I can't sleep    DASH Score  77.27 %        Objective measurements completed on examination: See above findings.      [facility] Adult PT Treatment/Exercise - 12/01/18 0001       Exercises   Exercises  Other Exercises    Other Exercises   performed HEP per handout      Modalities   Modalities  --   pt to ice at home            PT Education - 12/01/18 1459    Education Details  HEP &  POC    Person(s) Educated  Patient;Child(ren)   son   Methods  Explanation;Demonstration;Handout    Comprehension  Verbalized understanding;Returned demonstration          PT Long Term Goals - 12/01/18 1505      PT LONG TERM GOAL #1   Title  I with advanced HEP for upper body strength ( 01/12/2019)    Baseline  not exercising    Time  6    Period  Weeks    Status  New    Target Date  01/12/19      PT LONG TERM GOAL #2   Title  demo Rt shoulder ROM WFL to allow her to reach top shelf and perform normal household tasks ( 01/12/2019)    Baseline  flex 80, abduction 90, IR 55, ER 47    Time  6    Period  Weeks    Status  New    Target Date  01/12/19      PT LONG TERM GOAL #3   Title  demo Rt UE strength =/> 4+/5 to allow her to hold her arm up to wash/style her hair and self feed (01/12/2019)    Baseline  < 3-/5 in shoulder  through observation - not allowed in most protocols at this level    Time  6    Period  Weeks    Status  New    Target Date  01/12/19      PT LONG TERM GOAL #4   Title  improve quick DASH =/< 35% impaired ( 01/12/2019)    Baseline  77.27% impaired    Time  6    Period  Weeks    Status  New    Target Date  01/12/19      PT LONG TERM GOAL #5   Title  report no more than 2/10 pain to allow her to sleep per her previous level ( 01/12/2019)    Baseline  unable to tolerate lying on Rt side at all to sleep    Time  6    Period  Weeks    Status  New    Target Date  01/12/19             Plan - 12/01/18 1500    Clinical Impression Statement  58 yo female s/p Rt RTC repair ~ 7 wks ago. She forgot to bring MD notes with her so eval was conservative based on  known protocols.  She is still using her sling majority of the time, has  limited Rt shoulder ROM, lower arm WNL.  She has postural changes ,rounded shoulders.  Tightness around the shoulder joint with tenderness on palpation.  Minimal guarding with PROM.    Personal Factors and Comorbidities  Comorbidity 3+;Past/Current Experience;Fitness    Comorbidities  fibromyalgia, cervical fusion per pt, HTN, chronic back pain, on disability    Examination-Activity Limitations  Bathing;Reach Overhead;Self Feeding;Sleep  Stability/Clinical Decision Making  Evolving/Moderate complexity    Clinical Decision Making  Moderate    Rehab Potential  Good    PT Frequency  2x / week    PT Duration  6 weeks    PT Treatment/Interventions  Iontophoresis 4mg /ml Dexamethasone;Vasopneumatic Device;Patient/family education;Therapeutic activities;Moist Heat;Ultrasound;Therapeutic exercise;Passive range of motion;Cryotherapy;Electrical Stimulation;Manual techniques;Taping    PT Next Visit Plan  pt to bring in MD notes, progress Rt shoulder ROM AA - AROM, scapular stabilization, elbow/hand strength, modalities PRN    Consulted and Agree with Plan of Care  Patient;Family member/caregiver    Family Member Consulted  son       Patient will benefit from skilled therapeutic intervention in order to improve the following deficits and impairments:  Decreased range of motion, Impaired UE functional use, Increased muscle spasms, Pain, Postural dysfunction, Decreased strength  Visit Diagnosis: Stiffness of right shoulder, not elsewhere classified - Plan: PT plan of care cert/re-cert  Acute pain of right shoulder - Plan: PT plan of care cert/re-cert  Muscle weakness (generalized) - Plan: PT plan of care cert/re-cert  Abnormal posture - Plan: PT plan of care cert/re-cert     Problem List Patient Active Problem List   Diagnosis Date Noted  . Cervicalgia 03/05/2016  . Post laminectomy syndrome 03/05/2016  . Chronic pain syndrome 03/05/2016  . Fibromyalgia 03/05/2016  . Chronic right shoulder  pain 11/25/2015    Jeral Pinch PT  12/01/2018, 3:12 PM  [place], [place], [address] Phone: [phone]   Fax:  [phone]  Name: [name] [name] MRN: [id number] Date of Birth: 13-Jul-1960

## 2018-12-01 NOTE — Patient Instructions (Signed)
Access Code: [email or web address]  URL: [email or web address]  Date: 12/01/2018  Prepared by: Jeral Pinch   Exercises  Supine Shoulder External Rotation with Dowel - 10 reps - 3x daily  Seated Gripping Towel - 10 reps - 3x daily  Standing Backward Shoulder Rolls - 10 reps - 3x daily  Supine shoulder flexion with band - 10 reps - 1x daily  Seated Scapular Retraction - 10 reps - 3x daily  Standing shoulder flexion wall slides - 10 reps - 1x daily  Circular Shoulder Pendulum with Table Support - 10 reps - 3x daily

## 2018-12-08 ENCOUNTER — Other Ambulatory Visit: Payer: Self-pay

## 2018-12-08 ENCOUNTER — Ambulatory Visit: Payer: Medicaid Other | Admitting: Physical Therapy

## 2018-12-08 DIAGNOSIS — M25611 Stiffness of right shoulder, not elsewhere classified: Secondary | ICD-10-CM

## 2018-12-08 DIAGNOSIS — M25511 Pain in right shoulder: Secondary | ICD-10-CM

## 2018-12-08 NOTE — Therapy (Signed)
[facility] [address] [place], [place], [address] Phone: [phone]   Fax:  [phone]  Physical Therapy Treatment  Patient Details  Name: [name] [name] MRN: [id number] Date of Birth: 07-02-1960 Referring Provider (PT): Dr Jones BroomJustin Chandler   Encounter Date: 12/08/2018  PT End of Session - 12/08/18 1610    Visit Number  2    PT Start Time  1530    PT Stop Time  1620    PT Time Calculation (min)  50 min       Past Medical History:  Diagnosis Date  . Arthritis   . Chronic back pain   . Fibromyalgia   . GERD (gastroesophageal reflux disease)   . Hypertension     Past Surgical History:  Procedure Laterality Date  . APPENDECTOMY    . CARPAL TUNNEL RELEASE Right   . CHOLECYSTECTOMY    . HEMORRHOID SURGERY    . NECK SURGERY    . SHOULDER ARTHROSCOPY WITH ROTATOR CUFF REPAIR Right 10/10/2018   Procedure: SHOULDER ARTHROSCOPY WITH ROTATOR CUFF REPAIR;  Surgeon: Jones Broomhandler, Justin, MD;  Location: [facility];  Service: Orthopedics;  Laterality: Right;  . SHOULDER ARTHROSCOPY WITH SUBACROMIAL DECOMPRESSION Right 10/10/2018   Procedure: SHOULDER ARTHROSCOPY WITH SUBACROMIAL DECOMPRESSION; DISTAL CLAVICLE EXCISION;  Surgeon: Jones Broomhandler, Justin, MD;  Location: [facility];  Service: Orthopedics;  Laterality: Right;    There were no vitals filed for this visit.  Subjective Assessment - 12/08/18 1539    Subjective  "feeling pretty good, shoulder is a little aching"    Currently in Pain?  No/denies                       [facility] Adult PT Treatment/Exercise - 12/08/18 0001      Exercises   Exercises  Shoulder      Shoulder Exercises: Seated   Other Seated Exercises  UBE 2 min each direction      Shoulder Exercises: Standing   Extension  Strengthening;Both;20 reps;Theraband    Theraband Level (Shoulder Extension)  Level 2 (Red)    Row  Strengthening;20 reps;Theraband    Theraband Level (Shoulder Row)  Level 2 (Red)    Other Standing Exercises  finger ladder flex/abd    Other Standing Exercises  AAROM flexion, abduction, extension, and IR w/ stick      Modalities   Modalities  Vasopneumatic      Vasopneumatic   Number Minutes Vasopneumatic   10 minutes    Vasopnuematic Location   Shoulder    Vasopneumatic Pressure  Low    Vasopneumatic Temperature   36      Manual Therapy   Manual Therapy  Passive ROM    Passive ROM  shoulder PROM in all directions             PT Education - 12/08/18 1646    Education Details  added exercises to HEP (AAROM abduction, extension, IR, and finger climbs)    Person(s) Educated  Patient    Methods  Handout;Explanation    Comprehension  Verbalized understanding          PT Long Term Goals - 12/01/18 1505      PT LONG TERM GOAL #1   Title  I with advanced HEP for upper body strength ( 01/12/2019)    Baseline  not exercising    Time  6    Period  Weeks    Status  New    Target Date  01/12/19      PT LONG TERM GOAL #2   Title  demo Rt shoulder ROM WFL to allow her to reach top shelf and perform normal household tasks ( 01/12/2019)    Baseline  flex 80, abduction 90, IR 55, ER 47    Time  6    Period  Weeks    Status  New    Target Date  01/12/19      PT LONG TERM GOAL #3   Title  demo Rt UE strength =/> 4+/5 to allow her to hold her arm up to wash/style her hair and self feed (01/12/2019)    Baseline  < 3-/5 in shoulder  through observation - not allowed in most protocols at this level    Time  6    Period  Weeks    Status  New    Target Date  01/12/19      PT LONG TERM GOAL #4   Title  improve quick DASH =/< 35% impaired ( 01/12/2019)    Baseline  77.27% impaired    Time  6    Period  Weeks    Status  New    Target Date  01/12/19      PT LONG TERM GOAL #5   Title  report no more than 2/10 pain to allow her to sleep per her previous level ( 01/12/2019)    Baseline  unable to tolerate  lying on Rt side at all to sleep    Time  6    Period  Weeks    Status  New    Target Date  01/12/19            Plan - 12/08/18 1636    Clinical Impression Statement  pt tolerated PROM well. she does need cues to relax. pt able to perform finger ladder flexion and abduction. she is more limited in abduction. pt was given a couple new exercises to her HEP. pt needs tactile cues with AAROm abduction with stick to improve form and technique. pt agreed to try vaso at end of session.    Personal Factors and Comorbidities  Comorbidity 3+;Past/Current Experience;Fitness    Comorbidities  fibromyalgia, cervical fusion per pt, HTN, chronic back pain, on disability    Examination-Activity Limitations  Bathing;Reach Overhead;Self Feeding;Sleep    Stability/Clinical Decision Making  Evolving/Moderate complexity    Rehab Potential  Good    PT Frequency  2x / week    PT Duration  6 weeks    PT Treatment/Interventions  Iontophoresis 4mg /ml Dexamethasone;Vasopneumatic Device;Patient/family education;Therapeutic activities;Moist Heat;Ultrasound;Therapeutic exercise;Passive range of motion;Cryotherapy;Electrical Stimulation;Manual techniques;Taping    PT Next Visit Plan  continue to work on ROM, strength, and stabilization       Patient will benefit from skilled therapeutic intervention in order to improve the following deficits and impairments:  Decreased range of motion, Impaired UE functional use, Increased muscle spasms, Pain, Postural dysfunction, Decreased strength  Visit Diagnosis: Stiffness of right shoulder, not elsewhere classified  Acute pain of right shoulder     Problem List Patient Active Problem List   Diagnosis Date Noted  . Cervicalgia 03/05/2016  . Post laminectomy syndrome 03/05/2016  . Chronic pain syndrome 03/05/2016  . Fibromyalgia 03/05/2016  . Chronic right shoulder pain 11/25/2015    Barrett Henle, [place] 12/08/2018, 4:50 PM  [place], [place], [address] Phone: [phone]  Fax:  [phone]  Name: [name] [name] MRN: [id number] Date of Birth: 05-01-1960

## 2018-12-12 ENCOUNTER — Ambulatory Visit: Payer: Medicaid Other | Admitting: Physical Therapy

## 2018-12-14 ENCOUNTER — Other Ambulatory Visit: Payer: Self-pay

## 2018-12-14 ENCOUNTER — Ambulatory Visit: Payer: Medicaid Other | Admitting: Physical Therapy

## 2018-12-14 DIAGNOSIS — M25611 Stiffness of right shoulder, not elsewhere classified: Secondary | ICD-10-CM

## 2018-12-14 DIAGNOSIS — M25511 Pain in right shoulder: Secondary | ICD-10-CM

## 2018-12-14 NOTE — Therapy (Signed)
[place] [address] [place], [place], [address] Phone: [phone]   Fax:  [phone]  Physical Therapy Treatment  Patient Details  Name: [name] [name] MRN: [id number] Date of Birth: 04-19-1960 Referring Provider (PT): Dr Tania Ade   Encounter Date: 12/14/2018  PT End of Session - 12/14/18 1538    Visit Number  3    PT Start Time  1500    PT Stop Time  0037    PT Time Calculation (min)  48 min       Past Medical History:  Diagnosis Date  . Arthritis   . Chronic back pain   . Fibromyalgia   . GERD (gastroesophageal reflux disease)   . Hypertension     Past Surgical History:  Procedure Laterality Date  . APPENDECTOMY    . CARPAL TUNNEL RELEASE Right   . CHOLECYSTECTOMY    . HEMORRHOID SURGERY    . NECK SURGERY    . SHOULDER ARTHROSCOPY WITH ROTATOR CUFF REPAIR Right 10/10/2018   Procedure: SHOULDER ARTHROSCOPY WITH ROTATOR CUFF REPAIR;  Surgeon: Tania Ade, MD;  Location: [place] [facility];  Service: Orthopedics;  Laterality: Right;  . SHOULDER ARTHROSCOPY WITH SUBACROMIAL DECOMPRESSION Right 10/10/2018   Procedure: SHOULDER ARTHROSCOPY WITH SUBACROMIAL DECOMPRESSION; DISTAL CLAVICLE EXCISION;  Surgeon: Tania Ade, MD;  Location: [place] [facility];  Service: Orthopedics;  Laterality: Right;    There were no vitals filed for this visit.  Subjective Assessment - 12/14/18 1503    Subjective  "feeling pretty good, a little sore after last session"    Currently in Pain?  No/denies                       [facility] Adult PT Treatment/Exercise - 12/14/18 0001      Shoulder Exercises: Supine   Flexion  AAROM;10 reps   w/ cane     Shoulder Exercises: Seated   Other Seated Exercises  UBE  L1 x 3 min each direction      Shoulder Exercises: Standing   Extension  Strengthening;Both;20 reps;Theraband;10 reps    Theraband Level (Shoulder Extension)  Level 2  (Red)    Row  Strengthening;20 reps;Theraband;10 reps    Theraband Level (Shoulder Row)  Level 2 (Red)    Other Standing Exercises  finger ladder flex/abd, R biceps curls 3# 2 x 10    Other Standing Exercises  AAROM abduction, extension, and IR w/ stick x 15      Vasopneumatic   Number Minutes Vasopneumatic   10 minutes    Vasopnuematic Location   Shoulder    Vasopneumatic Pressure  Low    Vasopneumatic Temperature   36      Manual Therapy   Manual Therapy  Passive ROM    Passive ROM  shoulder PROM in all directions                  PT Long Term Goals - 12/14/18 1505      PT LONG TERM GOAL #1   Title  I with advanced HEP for upper body strength ( 01/12/2019)    Status  Achieved      PT LONG TERM GOAL #2   Title  demo Rt shoulder ROM WFL to allow her to reach top shelf and perform normal household tasks ( 01/12/2019)    Status  On-going      PT LONG TERM GOAL #3   Title  demo  Rt UE strength =/> 4+/5 to allow her to hold her arm up to wash/style her hair and self feed (01/12/2019)    Status  Partially Met      PT LONG TERM GOAL #5   Title  report no more than 2/10 pain to allow her to sleep per her previous level ( 01/12/2019)    Status  On-going            Plan - 12/14/18 1541    Clinical Impression Statement  pt continues to need cues to relax with PROM. pt has limited motion with finger ladder in abduction. pt needs tactile cues with abduction finger ladder to increase form and technique. pt complains of pain in the anterior R shoulder with biceps curls. pt able to increase reps with row and shoulder extension.    Personal Factors and Comorbidities  Comorbidity 3+;Past/Current Experience;Fitness    Comorbidities  fibromyalgia, cervical fusion per pt, HTN, chronic back pain, on disability    Stability/Clinical Decision Making  Evolving/Moderate complexity    Rehab Potential  Good    PT Frequency  2x / week    PT Duration  6 weeks    PT  Treatment/Interventions  Iontophoresis 78m/ml Dexamethasone;Vasopneumatic Device;Patient/family education;Therapeutic activities;Moist Heat;Ultrasound;Therapeutic exercise;Passive range of motion;Cryotherapy;Electrical Stimulation;Manual techniques;Taping    PT Next Visit Plan  continue to work on ROM, strength, and stabilization       Patient will benefit from skilled therapeutic intervention in order to improve the following deficits and impairments:  Decreased range of motion, Impaired UE functional use, Increased muscle spasms, Pain, Postural dysfunction, Decreased strength  Visit Diagnosis: Stiffness of right shoulder, not elsewhere classified  Acute pain of right shoulder     Problem List Patient Active Problem List   Diagnosis Date Noted  . Cervicalgia 03/05/2016  . Post laminectomy syndrome 03/05/2016  . Chronic pain syndrome 03/05/2016  . Fibromyalgia 03/05/2016  . Chronic right shoulder pain 11/25/2015    SBarrett Henle [place]11/25/2020, 3:46 PM  [place] [address]: [phone]  Fax:  [phone] Name: [name] STIEFMRN: [id number] of Birth: [birth year]

## 2018-12-19 ENCOUNTER — Other Ambulatory Visit: Payer: Self-pay

## 2018-12-19 DIAGNOSIS — Z20822 Contact with and (suspected) exposure to covid-19: Secondary | ICD-10-CM

## 2018-12-20 LAB — NOVEL CORONAVIRUS, NAA: SARS-CoV-2, NAA: NOT DETECTED

## 2018-12-29 ENCOUNTER — Other Ambulatory Visit: Payer: Self-pay

## 2018-12-29 DIAGNOSIS — Z20822 Contact with and (suspected) exposure to covid-19: Secondary | ICD-10-CM

## 2018-12-31 LAB — NOVEL CORONAVIRUS, NAA: SARS-CoV-2, NAA: NOT DETECTED

## 2019-01-31 ENCOUNTER — Emergency Department (HOSPITAL_COMMUNITY)
Admission: EM | Admit: 2019-01-31 | Discharge: 2019-01-31 | Disposition: A | Discharge status: Home / Self Care | Payer: Medicaid Other | Attending: Emergency Medicine | Admitting: Emergency Medicine

## 2019-01-31 ENCOUNTER — Encounter (HOSPITAL_COMMUNITY): Payer: Self-pay

## 2019-01-31 ENCOUNTER — Other Ambulatory Visit: Payer: Self-pay

## 2019-01-31 DIAGNOSIS — G43009 Migraine without aura, not intractable, without status migrainosus: Secondary | ICD-10-CM | POA: Diagnosis not present

## 2019-01-31 DIAGNOSIS — R519 Headache, unspecified: Secondary | ICD-10-CM | POA: Diagnosis present

## 2019-01-31 DIAGNOSIS — I10 Essential (primary) hypertension: Secondary | ICD-10-CM | POA: Diagnosis not present

## 2019-01-31 DIAGNOSIS — Z87891 Personal history of nicotine dependence: Secondary | ICD-10-CM | POA: Insufficient documentation

## 2019-01-31 DIAGNOSIS — Z79899 Other long term (current) drug therapy: Secondary | ICD-10-CM | POA: Diagnosis not present

## 2019-01-31 DIAGNOSIS — R42 Dizziness and giddiness: Secondary | ICD-10-CM | POA: Diagnosis not present

## 2019-01-31 MED ORDER — DEXAMETHASONE 4 MG PO TABS
10.0000 mg | ORAL_TABLET | Freq: Once | ORAL | Status: AC
  Administered 2019-01-31: 23:00:00 10 mg via ORAL
  Filled 2019-01-31: qty 2

## 2019-01-31 MED ORDER — DIPHENHYDRAMINE HCL 50 MG/ML IJ SOLN
25.0000 mg | Freq: Once | INTRAMUSCULAR | Status: AC
  Administered 2019-01-31: 25 mg via INTRAMUSCULAR
  Filled 2019-01-31: qty 1

## 2019-01-31 MED ORDER — PROCHLORPERAZINE EDISYLATE 10 MG/2ML IJ SOLN
10.0000 mg | Freq: Once | INTRAMUSCULAR | Status: AC
  Administered 2019-01-31: 10 mg via INTRAMUSCULAR
  Filled 2019-01-31: qty 2

## 2019-01-31 NOTE — Discharge Instructions (Signed)
Return for worsening headache or difficulty with speech or swallowing.  Return for one-sided numbness or weakness.  Please call your family doctor and let them know about your headache.  See if they want you to follow-up with a neurologist.

## 2019-01-31 NOTE — ED Provider Notes (Signed)
[facility] DEPT Provider Note   CSN: [id number] Arrival date & time: 01/31/19  1942     History Chief Complaint  Patient presents with  . Headache  . Dizziness    [name] [name] is a 59 y.o. female.  59 yo F with a chief complaints of headache.  This is left-sided and described as throbbing.  She has felt some paresthesias with it which is atypical for her headaches.  Otherwise feels the same.  Nothing seems to make it worse.  Laying down and resting makes it better.  She is on monthly injections for this and felt that they have significantly improved her headaches.  She is felt somewhat dizzy.  She denies difficulty with swallowing or speech.  Denies unilateral numbness or tingling.  Denies head trauma.  The history is provided by the patient.  Headache Pain location:  L parietal Quality:  Dull Radiates to:  L neck Severity currently:  8/10 Severity at highest:  8/10 Onset quality:  Gradual Duration:  3 days Timing:  Constant Progression:  Worsening Chronicity:  New Similar to prior headaches: no   Relieved by:  Nothing Worsened by:  Nothing Ineffective treatments:  None tried Associated symptoms: dizziness   Associated symptoms: no congestion, no fever, no myalgias, no nausea and no vomiting   Dizziness Associated symptoms: headaches   Associated symptoms: no chest pain, no nausea, no palpitations, no shortness of breath and no vomiting        Past Medical History:  Diagnosis Date  . Arthritis   . Chronic back pain   . Fibromyalgia   . GERD (gastroesophageal reflux disease)   . Hypertension     Patient Active Problem List   Diagnosis Date Noted  . Cervicalgia 03/05/2016  . Post laminectomy syndrome 03/05/2016  . Chronic pain syndrome 03/05/2016  . Fibromyalgia 03/05/2016  . Chronic right shoulder pain 11/25/2015    Past Surgical History:  Procedure Laterality Date  . APPENDECTOMY    . CARPAL TUNNEL RELEASE Right   .  CHOLECYSTECTOMY    . HEMORRHOID SURGERY    . NECK SURGERY    . SHOULDER ARTHROSCOPY WITH ROTATOR CUFF REPAIR Right 10/10/2018   Procedure: SHOULDER ARTHROSCOPY WITH ROTATOR CUFF REPAIR;  Surgeon: Tania Ade, MD;  Location: [facility];  Service: Orthopedics;  Laterality: Right;  . SHOULDER ARTHROSCOPY WITH SUBACROMIAL DECOMPRESSION Right 10/10/2018   Procedure: SHOULDER ARTHROSCOPY WITH SUBACROMIAL DECOMPRESSION; DISTAL CLAVICLE EXCISION;  Surgeon: Tania Ade, MD;  Location: [facility];  Service: Orthopedics;  Laterality: Right;     OB History   No obstetric history on file.     History reviewed. No pertinent family history.  Social History   Tobacco Use  . Smoking status: Former Research scientist (life sciences)  . Smokeless tobacco: Never Used  Substance Use Topics  . Alcohol use: Yes    Comment: socially  . Drug use: No    Home Medications Prior to Admission medications   Medication Sig Start Date End Date Taking? Authorizing Provider  amLODipine (NORVASC) 5 MG tablet Take 5 mg by mouth daily. 04/22/17  Yes [provider]  atorvastatin (LIPITOR) 10 MG tablet Take 10 mg by mouth daily. 09/21/18  Yes [provider]  butalbital-acetaminophen-caffeine (FIORICET) 50-325-40 MG tablet Take 1 tablet by mouth every 6 (six) hours as needed for migraine. 08/15/18  Yes [provider]  EMGALITY 120 MG/ML SOAJ Inject 120 mg into the skin every 30 (thirty) days. 01/02/19  Yes  [provider]  hydrochlorothiazide (HYDRODIURIL) 25 MG tablet Take 1 tablet (25 mg total) by mouth daily. 08/17/14  Yes Muthersbaugh, Dahlia Client, PA-C  omeprazole (PRILOSEC) 40 MG capsule Take 40 mg by mouth daily. 10/24/18  Yes [provider]  pregabalin (LYRICA) 225 MG capsule Take 225 mg by mouth 2 (two) times daily. 12/15/18  Yes [provider]  tiZANidine (ZANAFLEX) 4 MG tablet Take 1 tablet (4 mg total) by mouth every 8 (eight) hours as needed for  muscle spasms. 10/10/18  Yes Jiles Harold, PA-C  cyclobenzaprine (FLEXERIL) 10 MG tablet Take 1 tablet (10 mg total) by mouth 3 (three) times daily as needed for muscle spasms. Patient not taking: Reported on 12/01/2018 10/29/14   Linwood Dibbles, MD  HYDROcodone-acetaminophen [facility]) 5-325 MG tablet Take 1 tablet by mouth every 6 (six) hours as needed for severe pain. Patient not taking: Reported on 01/31/2019 05/14/17   Loren Racer, MD  hydrOXYzine (ATARAX/VISTARIL) 25 MG tablet Take 0.5-1 tablets (12.5-25 mg total) by mouth every 8 (eight) hours as needed for itching. Patient not taking: Reported on 01/31/2019 03/02/17   Maczis, Elmer Sow, PA-C  ondansetron (ZOFRAN) 4 MG tablet Take 1 tablet (4 mg total) by mouth every 6 (six) hours as needed for nausea or vomiting. Patient not taking: Reported on 01/31/2019 02/14/18   Loren Racer, MD  oxyCODONE-acetaminophen (PERCOCET) 5-325 MG tablet Take 1-2 tablets every 4 hours as needed for post operative pain. MAX 6/day Patient not taking: Reported on 12/01/2018 10/10/18   Jiles Harold, PA-C  pantoprazole (PROTONIX) 20 MG tablet Take 1 tablet (20 mg total) by mouth daily. Patient not taking: Reported on 01/31/2019 02/14/18   Loren Racer, MD  traMADol (ULTRAM) 50 MG tablet Take 1 tablet (50 mg total) by mouth every 6 (six) hours as needed for moderate pain or severe pain. Patient not taking: Reported on 12/01/2018 03/09/16   Tyrell Antonio, MD    Allergies    Patient has no known allergies.  Review of Systems   Review of Systems  Constitutional: Negative for chills and fever.  HENT: Negative for congestion and rhinorrhea.   Eyes: Negative for redness and visual disturbance.  Respiratory: Negative for shortness of breath and wheezing.   Cardiovascular: Negative for chest pain and palpitations.  Gastrointestinal: Negative for nausea and vomiting.  Genitourinary: Negative for dysuria and urgency.  Musculoskeletal: Negative for  arthralgias and myalgias.  Skin: Negative for pallor and wound.  Neurological: Positive for dizziness and headaches.    Physical Exam Updated Vital Signs BP (!) 167/90   Pulse 68   Temp 99.2 F (37.3 C) (Oral)   Resp 16   Ht 5\' 1"  (1.549 m)   Wt 80 kg   SpO2 97%   BMI 33.31 kg/m   Physical Exam Vitals and nursing note reviewed.  Constitutional:      General: She is not in acute distress.    Appearance: She is well-developed. She is not diaphoretic.  HENT:     Head: Normocephalic and atraumatic.  Eyes:     Pupils: Pupils are equal, round, and reactive to light.  Cardiovascular:     Rate and Rhythm: Normal rate and regular rhythm.     Heart sounds: No murmur. No friction rub. No gallop.   Pulmonary:     Effort: Pulmonary effort is normal.     Breath sounds: No wheezing or rales.  Abdominal:     General: There is no distension.     Palpations: Abdomen is  soft.     Tenderness: There is no abdominal tenderness.  Musculoskeletal:        General: No tenderness.     Cervical back: Normal range of motion and neck supple.  Skin:    General: Skin is warm and dry.  Neurological:     Mental Status: She is alert and oriented to person, place, and time.     Cranial Nerves: Cranial nerves are intact.     Sensory: Sensation is intact.     Motor: Motor function is intact.     Coordination: Coordination is intact.     Gait: Gait is intact.     Comments: Benign abdominal exam  Psychiatric:        Behavior: Behavior normal.     ED Results / Procedures / Treatments   Labs (all labs ordered are listed, but only abnormal results are displayed) Labs Reviewed - No data to display  EKG None  Radiology No results found.  Procedures Procedures (including critical care time)  Medications Ordered in ED Medications  prochlorperazine (COMPAZINE) injection 10 mg (10 mg Intramuscular Given 01/31/19 2240)  diphenhydrAMINE (BENADRYL) injection 25 mg (25 mg Intramuscular Given  01/31/19 2239)  dexamethasone (DECADRON) tablet 10 mg (10 mg Oral Given 01/31/19 2238)    ED Course  I have reviewed the triage vital signs and the nursing notes.  Pertinent labs & imaging results that were available during my care of the patient were reviewed by me and considered in my medical decision making (see chart for details).    MDM Rules/Calculators/A&P                      59 yo F with a chief complaint of left-sided headache.  Feels like her typical headaches with this is associated with some paresthesias in the left side and some dizziness.  Benign neurologic exam.  She is having a persistent cough and I am in the room the denies any cough prior to coming to the ED.  We will give a headache cocktail and reassess.  Patient feeling much better.  Will d/c home.  PCP follow up.   The patients results and plan were reviewed and discussed.   Any x-rays performed were independently reviewed by myself.   Differential diagnosis were considered with the presenting HPI.  Medications  prochlorperazine (COMPAZINE) injection 10 mg (10 mg Intramuscular Given 01/31/19 2240)  diphenhydrAMINE (BENADRYL) injection 25 mg (25 mg Intramuscular Given 01/31/19 2239)  dexamethasone (DECADRON) tablet 10 mg (10 mg Oral Given 01/31/19 2238)    Vitals:   01/31/19 2031 01/31/19 2033 01/31/19 2215 01/31/19 2230  BP:  (!) 148/84 (!) 172/85 (!) 167/90  Pulse: 67 69 64 68  Resp: 19 11 17 16   Temp:      TempSrc:      SpO2: 98% 96% 100% 97%  Weight:      Height:        Final diagnoses:  Migraine without aura and without status migrainosus, not intractable    Admission/ observation were discussed with the admitting physician, patient and/or family and they are comfortable with the plan.   Final Clinical Impression(s) / ED Diagnoses Final diagnoses:  Migraine without aura and without status migrainosus, not intractable    Rx / DC Orders ED Discharge Orders    None       ,  DO 01/31/19 2323

## 2019-01-31 NOTE — ED Triage Notes (Signed)
Pt reports she started having some facial swelling that started early this morning. Pt reports increased swelling throughout the day which has now made her face feel numb. Pt maintaining secretions and airway. Pt reports she has never had an allergic reaction to anything before.

## 2019-01-31 NOTE — ED Notes (Addendum)
Pt states she has had left sided migraine for the past 2 days. Pt states she has history of migraines but the medication she normally takes has not been helping like it usually does. Pt states pain radiates down back of her neck. Pt states she has been feeling a little light headed as well. She states today she woke up with headache and left side of her face swollen. The swelling has gotten progressively worse. She states she now feels as though it has affected her speech.

## 2019-05-05 ENCOUNTER — Other Ambulatory Visit: Payer: Self-pay | Admitting: *Deleted

## 2019-05-05 DIAGNOSIS — Z1231 Encounter for screening mammogram for malignant neoplasm of breast: Secondary | ICD-10-CM

## 2019-07-05 ENCOUNTER — Other Ambulatory Visit: Payer: Self-pay

## 2019-07-05 ENCOUNTER — Emergency Department (HOSPITAL_COMMUNITY): Payer: Medicaid Other

## 2019-07-05 ENCOUNTER — Encounter (HOSPITAL_COMMUNITY): Payer: Self-pay

## 2019-07-05 ENCOUNTER — Emergency Department (HOSPITAL_COMMUNITY)
Admission: EM | Admit: 2019-07-05 | Discharge: 2019-07-05 | Disposition: A | Discharge status: Home / Self Care | Payer: Medicaid Other | Attending: Emergency Medicine | Admitting: Emergency Medicine

## 2019-07-05 DIAGNOSIS — Z79899 Other long term (current) drug therapy: Secondary | ICD-10-CM | POA: Insufficient documentation

## 2019-07-05 DIAGNOSIS — M1812 Unilateral primary osteoarthritis of first carpometacarpal joint, left hand: Secondary | ICD-10-CM

## 2019-07-05 DIAGNOSIS — I10 Essential (primary) hypertension: Secondary | ICD-10-CM | POA: Diagnosis not present

## 2019-07-05 DIAGNOSIS — M79643 Pain in unspecified hand: Secondary | ICD-10-CM | POA: Diagnosis present

## 2019-07-05 DIAGNOSIS — M79645 Pain in left finger(s): Secondary | ICD-10-CM | POA: Insufficient documentation

## 2019-07-05 DIAGNOSIS — R2232 Localized swelling, mass and lump, left upper limb: Secondary | ICD-10-CM | POA: Insufficient documentation

## 2019-07-05 DIAGNOSIS — K219 Gastro-esophageal reflux disease without esophagitis: Secondary | ICD-10-CM | POA: Insufficient documentation

## 2019-07-05 MED ORDER — TRAMADOL HCL 50 MG PO TABS: 50.0000 mg | ORAL_TABLET | Freq: Four times a day (QID) | ORAL | 0 refills | Status: AC | PRN

## 2019-07-05 MED ORDER — TRAMADOL HCL 50 MG PO TABS
50.0000 mg | ORAL_TABLET | Freq: Once | ORAL | Status: AC
  Administered 2019-07-05: 50 mg via ORAL
  Filled 2019-07-05: qty 1

## 2019-07-05 NOTE — Discharge Instructions (Signed)
Take tylenol for pain. Take tramadol for severe pain   Continue taking your blood pressure medicine   See your orthopedic doctor for follow up   Use a splint for comfort. Your xray today showed no fracture   Return to ER if you have worse thumb swelling or pain

## 2019-07-05 NOTE — ED Triage Notes (Signed)
Arrive POV. Patient reports left had pain. Mostly around thumb. Patient states about 1 1/2 mos ago she "popped her thumb" back into place and it seems to be getting worse instead of better.

## 2019-07-05 NOTE — ED Provider Notes (Signed)
[facility] DEPT Provider Note   CSN: [id number] Arrival date & time: 07/05/19  2030     History Chief Complaint  Patient presents with  . Hand Pain    [name] [name] is a 59 y.o. female hx of fibromyalgia, HTN, chronic back pain, arthritis, here with L thumb pain. Patient states that she thought she dislocated her thumb a month ago and she pulled on it and it became more painful. She denies any injury to the thumb.   The history is provided by the patient.       Past Medical History:  Diagnosis Date  . Arthritis   . Chronic back pain   . Fibromyalgia   . GERD (gastroesophageal reflux disease)   . Hypertension     Patient Active Problem List   Diagnosis Date Noted  . Cervicalgia 03/05/2016  . Post laminectomy syndrome 03/05/2016  . Chronic pain syndrome 03/05/2016  . Fibromyalgia 03/05/2016  . Chronic right shoulder pain 11/25/2015    Past Surgical History:  Procedure Laterality Date  . APPENDECTOMY    . CARPAL TUNNEL RELEASE Right   . CHOLECYSTECTOMY    . HEMORRHOID SURGERY    . NECK SURGERY    . SHOULDER ARTHROSCOPY WITH ROTATOR CUFF REPAIR Right 10/10/2018   Procedure: SHOULDER ARTHROSCOPY WITH ROTATOR CUFF REPAIR;  Surgeon: Tania Ade, MD;  Location: [facility];  Service: Orthopedics;  Laterality: Right;  . SHOULDER ARTHROSCOPY WITH SUBACROMIAL DECOMPRESSION Right 10/10/2018   Procedure: SHOULDER ARTHROSCOPY WITH SUBACROMIAL DECOMPRESSION; DISTAL CLAVICLE EXCISION;  Surgeon: Tania Ade, MD;  Location: [facility];  Service: Orthopedics;  Laterality: Right;     OB History   No obstetric history on file.     No family history on file.  Social History   Tobacco Use  . Smoking status: Former Research scientist (life sciences)  . Smokeless tobacco: Never Used  Substance Use Topics  . Alcohol use: Yes    Comment: socially  . Drug use: No    Home Medications Prior to Admission medications   Medication Sig  Start Date End Date Taking? Authorizing Provider  amLODipine (NORVASC) 5 MG tablet Take 5 mg by mouth daily. 04/22/17   [provider]  atorvastatin (LIPITOR) 10 MG tablet Take 10 mg by mouth daily. 09/21/18   [provider]  butalbital-acetaminophen-caffeine (FIORICET) 50-325-40 MG tablet Take 1 tablet by mouth every 6 (six) hours as needed for migraine. 08/15/18   [provider]  cyclobenzaprine (FLEXERIL) 10 MG tablet Take 1 tablet (10 mg total) by mouth 3 (three) times daily as needed for muscle spasms. Patient not taking: Reported on 12/01/2018 10/29/14   Dorie Rank, MD  EMGALITY 120 MG/ML SOAJ Inject 120 mg into the skin every 30 (thirty) days. 01/02/19   [provider]  hydrochlorothiazide (HYDRODIURIL) 25 MG tablet Take 1 tablet (25 mg total) by mouth daily. 08/17/14   Muthersbaugh, Jarrett Soho, PA-C  HYDROcodone-acetaminophen (NORCO) 5-325 MG tablet Take 1 tablet by mouth every 6 (six) hours as needed for severe pain. Patient not taking: Reported on 01/31/2019 05/14/17   Julianne Rice, MD  hydrOXYzine (ATARAX/VISTARIL) 25 MG tablet Take 0.5-1 tablets (12.5-25 mg total) by mouth every 8 (eight) hours as needed for itching. Patient not taking: Reported on 01/31/2019 03/02/17   Maczis, Barth Kirks, PA-C  omeprazole (PRILOSEC) 40 MG capsule Take 40 mg by mouth daily. 10/24/18   [provider]  ondansetron (ZOFRAN) 4 MG tablet Take 1 tablet (4 mg total) by  mouth every 6 (six) hours as needed for nausea or vomiting. Patient not taking: Reported on 01/31/2019 02/14/18   Loren Racer, MD  oxyCODONE-acetaminophen (PERCOCET) 5-325 MG tablet Take 1-2 tablets every 4 hours as needed for post operative pain. MAX 6/day Patient not taking: Reported on 12/01/2018 10/10/18   Jiles Harold, PA-C  pantoprazole (PROTONIX) 20 MG tablet Take 1 tablet (20 mg total) by mouth daily. Patient not taking: Reported on 01/31/2019 02/14/18   Loren Racer, MD  pregabalin  (LYRICA) 225 MG capsule Take 225 mg by mouth 2 (two) times daily. 12/15/18   [provider]  tiZANidine (ZANAFLEX) 4 MG tablet Take 1 tablet (4 mg total) by mouth every 8 (eight) hours as needed for muscle spasms. 10/10/18   Jiles Harold, PA-C  traMADol (ULTRAM) 50 MG tablet Take 1 tablet (50 mg total) by mouth every 6 (six) hours as needed for moderate pain or severe pain. Patient not taking: Reported on 12/01/2018 03/09/16   Tyrell Antonio, MD    Allergies    Patient has no known allergies.  Review of Systems   Review of Systems  Musculoskeletal:       L thumb pain   All other systems reviewed and are negative.   Physical Exam Updated Vital Signs BP (!) 176/107 (BP Location: Left Arm)   Pulse 79   Temp 98.8 F (37.1 C) (Oral)   Resp 17   Ht 5\' 1"  (1.549 m)   Wt 78.5 kg   SpO2 95%   BMI 32.69 kg/m   Physical Exam Vitals and nursing note reviewed.  HENT:     Head: Normocephalic.     Nose: Nose normal.     Mouth/Throat:     Mouth: Mucous membranes are moist.  Eyes:     Extraocular Movements: Extraocular movements intact.     Pupils: Pupils are equal, round, and reactive to light.  Cardiovascular:     Rate and Rhythm: Normal rate and regular rhythm.     Pulses: Normal pulses.  Pulmonary:     Effort: Pulmonary effort is normal.  Abdominal:     General: Abdomen is flat.  Musculoskeletal:     Cervical back: Normal range of motion.     Comments: L thumb slightly swollen MCP joint. Dec ROM. Nl capillary refill. 2+ radial pulse   Skin:    General: Skin is warm.     Capillary Refill: Capillary refill takes less than 2 seconds.  Neurological:     General: No focal deficit present.     Mental Status: She is alert.  Psychiatric:        Mood and Affect: Mood normal.        Behavior: Behavior normal.     ED Results / Procedures / Treatments   Labs (all labs ordered are listed, but only abnormal results are displayed) Labs Reviewed - No data to  display  EKG None  Radiology DG Finger Thumb Left  Result Date: 07/05/2019 CLINICAL DATA:  Left first digit pain, injury 1.5 months ago EXAM: LEFT THUMB 2+V COMPARISON:  05/25/2015 FINDINGS: Frontal, oblique, and lateral views of the left first digit are obtained. No acute displaced fracture, subluxation, or dislocation. There is prominent osteoarthritis at the first and second carpometacarpal joints and first metacarpophalangeal joint. Soft tissues are unremarkable. IMPRESSION: 1. Osteoarthritis.  No acute fracture. Electronically Signed   By: 07/25/2015 M.D.   On: 07/05/2019 21:18    Procedures Procedures (including critical care time)  Medications Ordered  in ED Medications  traMADol (ULTRAM) tablet 50 mg (50 mg Oral Given 07/05/19 2250)    ED Course  I have reviewed the triage vital signs and the nursing notes.  Pertinent labs & imaging results that were available during my care of the patient were reviewed by me and considered in my medical decision making (see chart for details).    MDM Rules/Calculators/A&P                          Willadeen A Alcivar is a 59 y.o. female here with L thumb pain and swelling. Neurovascular intact. Xray showed arthritis. Patient has hx of HTN and didn't take her meds. Will give thumb spica splint and give tramadol for pain. Patient states that she can't get NSAIDs due to previous bleeding issues. Patient has ortho follow up already.   Final Clinical Impression(s) / ED Diagnoses Final diagnoses:  None    Rx / DC Orders ED Discharge Orders    None       Charlynne Pander, MD 07/05/19 2300

## 2019-10-05 ENCOUNTER — Other Ambulatory Visit: Payer: Self-pay

## 2019-10-05 ENCOUNTER — Emergency Department (HOSPITAL_COMMUNITY): Payer: Medicaid Other

## 2019-10-05 ENCOUNTER — Encounter (HOSPITAL_COMMUNITY): Payer: Self-pay

## 2019-10-05 DIAGNOSIS — I10 Essential (primary) hypertension: Secondary | ICD-10-CM | POA: Insufficient documentation

## 2019-10-05 DIAGNOSIS — R05 Cough: Secondary | ICD-10-CM | POA: Insufficient documentation

## 2019-10-05 DIAGNOSIS — Z79899 Other long term (current) drug therapy: Secondary | ICD-10-CM | POA: Diagnosis not present

## 2019-10-05 DIAGNOSIS — Z79891 Long term (current) use of opiate analgesic: Secondary | ICD-10-CM | POA: Insufficient documentation

## 2019-10-05 LAB — CBC
HCT: 38.8 % (ref 36.0–46.0)
Hemoglobin: 13.5 g/dL (ref 12.0–15.0)
MCH: 32.1 pg (ref 26.0–34.0)
MCHC: 34.8 g/dL (ref 30.0–36.0)
MCV: 92.4 fL (ref 80.0–100.0)
Platelets: 282 10*3/uL (ref 150–400)
RBC: 4.2 MIL/uL (ref 3.87–5.11)
RDW: 11.9 % (ref 11.5–15.5)
WBC: 8.7 10*3/uL (ref 4.0–10.5)
nRBC: 0 % (ref 0.0–0.2)

## 2019-10-05 LAB — BASIC METABOLIC PANEL
Anion gap: 9 (ref 5–15)
BUN: 14 mg/dL (ref 6–20)
CO2: 24 mmol/L (ref 22–32)
Calcium: 8.9 mg/dL (ref 8.9–10.3)
Chloride: 106 mmol/L (ref 98–111)
Creatinine, Ser: 0.9 mg/dL (ref 0.44–1.00)
GFR calc Af Amer: >60 mL/min (ref >60)
GFR calc non Af Amer: >60 mL/min (ref >60)
Glucose, Bld: 124 mg/dL — ABNORMAL HIGH (ref 70–99)
Potassium: 3.3 mmol/L — ABNORMAL LOW (ref 3.5–5.1)
Sodium: 139 mmol/L (ref 135–145)

## 2019-10-05 LAB — HCG, QUANTITATIVE, PREGNANCY: hCG, Beta Chain, Quant, S: 10 m[IU]/mL — ABNORMAL HIGH (ref <5)

## 2019-10-05 LAB — TROPONIN I (HIGH SENSITIVITY): Troponin I (High Sensitivity): 3 ng/L (ref <18)

## 2019-10-05 NOTE — ED Triage Notes (Signed)
Patient arrived stating she has had a nonproductive cough for months that worsens when she lays down to go to sleep. Reports pain in her chest from coughing and some shortness of breath.

## 2019-10-06 ENCOUNTER — Emergency Department (HOSPITAL_COMMUNITY)
Admission: EM | Admit: 2019-10-06 | Discharge: 2019-10-06 | Disposition: A | Discharge status: Home / Self Care | Payer: Medicaid Other | Attending: Emergency Medicine | Admitting: Emergency Medicine

## 2019-10-06 DIAGNOSIS — J40 Bronchitis, not specified as acute or chronic: Secondary | ICD-10-CM

## 2019-10-06 LAB — HEPATIC FUNCTION PANEL
ALT: 18 U/L (ref 0–44)
AST: 16 U/L (ref 15–41)
Albumin: 4 g/dL (ref 3.5–5.0)
Alkaline Phosphatase: 65 U/L (ref 38–126)
Bilirubin, Direct: <0.1 mg/dL (ref 0.0–0.2)
Total Bilirubin: 0.5 mg/dL (ref 0.3–1.2)
Total Protein: 8.3 g/dL — ABNORMAL HIGH (ref 6.5–8.1)

## 2019-10-06 LAB — BRAIN NATRIURETIC PEPTIDE: B Natriuretic Peptide: 34.5 pg/mL (ref 0.0–100.0)

## 2019-10-06 MED ORDER — DOXYCYCLINE HYCLATE 100 MG PO CAPS: 100.0000 mg | ORAL_CAPSULE | Freq: Two times a day (BID) | ORAL | 0 refills | Status: AC

## 2019-10-06 MED ORDER — PREDNISONE 20 MG PO TABS: 20.0000 mg | ORAL_TABLET | Freq: Two times a day (BID) | ORAL | 0 refills | Status: AC

## 2019-10-06 NOTE — ED Provider Notes (Signed)
[facility]-EMERGENCY DEPT Provider Note   CSN: [id number] Arrival date & time: 10/05/19  2026     History Chief Complaint  Patient presents with  . Cough    [name] [name] is a 59 y.o. female.  HPI She presents for evaluation of a painful cough present for 3 weeks.  The cough is nonproductive but sometimes she gags on mucus.  She denies fever, chills, nausea, vomit.  She had a prolonged stay in the emergency department waiting room before being placed in a examination room today.  She had Covid vaccines in April and May 2021.  No recent sick contacts.  She reports a history of similar problems in the past when she had "bronchitis."  She does not smoke cigarettes.  There are no other known modifying factors.    Past Medical History:  Diagnosis Date  . Arthritis   . Chronic back pain   . Fibromyalgia   . GERD (gastroesophageal reflux disease)   . Hypertension     Patient Active Problem List   Diagnosis Date Noted  . Cervicalgia 03/05/2016  . Post laminectomy syndrome 03/05/2016  . Chronic pain syndrome 03/05/2016  . Fibromyalgia 03/05/2016  . Chronic right shoulder pain 11/25/2015    Past Surgical History:  Procedure Laterality Date  . APPENDECTOMY    . CARPAL TUNNEL RELEASE Right   . CHOLECYSTECTOMY    . HEMORRHOID SURGERY    . NECK SURGERY    . SHOULDER ARTHROSCOPY WITH ROTATOR CUFF REPAIR Right 10/10/2018   Procedure: SHOULDER ARTHROSCOPY WITH ROTATOR CUFF REPAIR;  Surgeon: Jones Broom, MD;  Location: [facility];  Service: Orthopedics;  Laterality: Right;  . SHOULDER ARTHROSCOPY WITH SUBACROMIAL DECOMPRESSION Right 10/10/2018   Procedure: SHOULDER ARTHROSCOPY WITH SUBACROMIAL DECOMPRESSION; DISTAL CLAVICLE EXCISION;  Surgeon: Jones Broom, MD;  Location: [facility];  Service: Orthopedics;  Laterality: Right;     OB History   No obstetric history on file.     No family history on file.  Social  History   Tobacco Use  . Smoking status: Former Games developer  . Smokeless tobacco: Never Used  Substance Use Topics  . Alcohol use: Yes    Comment: socially  . Drug use: No    Home Medications Prior to Admission medications   Medication Sig Start Date End Date Taking? Authorizing Provider  amLODipine (NORVASC) 5 MG tablet Take 5 mg by mouth daily. 04/22/17   [provider]  atorvastatin (LIPITOR) 10 MG tablet Take 10 mg by mouth daily. 09/21/18   [provider]  butalbital-acetaminophen-caffeine (FIORICET) 50-325-40 MG tablet Take 1 tablet by mouth every 6 (six) hours as needed for migraine. 08/15/18   [provider]  cyclobenzaprine (FLEXERIL) 10 MG tablet Take 1 tablet (10 mg total) by mouth 3 (three) times daily as needed for muscle spasms. Patient not taking: Reported on 12/01/2018 10/29/14   Linwood Dibbles, MD  doxycycline (VIBRAMYCIN) 100 MG capsule Take 1 capsule (100 mg total) by mouth 2 (two) times daily. One po bid x 7 days 10/06/19   Mancel Bale, MD  EMGALITY 120 MG/ML SOAJ Inject 120 mg into the skin every 30 (thirty) days. 01/02/19   [provider]  hydrochlorothiazide (HYDRODIURIL) 25 MG tablet Take 1 tablet (25 mg total) by mouth daily. 08/17/14   Muthersbaugh, Dahlia Client, PA-C  HYDROcodone-acetaminophen (NORCO) 5-325 MG tablet Take 1 tablet by mouth every 6 (six) hours as needed for severe pain. Patient not taking: Reported on 01/31/2019 05/14/17  Loren RacerYelverton, David, MD  hydrOXYzine (ATARAX/VISTARIL) 25 MG tablet Take 0.5-1 tablets (12.5-25 mg total) by mouth every 8 (eight) hours as needed for itching. Patient not taking: Reported on 01/31/2019 03/02/17   Maczis, Elmer SowMichael M, PA-C  omeprazole (PRILOSEC) 40 MG capsule Take 40 mg by mouth daily. 10/24/18   [provider]  ondansetron (ZOFRAN) 4 MG tablet Take 1 tablet (4 mg total) by mouth every 6 (six) hours as needed for nausea or vomiting. Patient not taking: Reported on 01/31/2019 02/14/18    Loren RacerYelverton, David, MD  oxyCODONE-acetaminophen (PERCOCET) 5-325 MG tablet Take 1-2 tablets every 4 hours as needed for post operative pain. MAX 6/day Patient not taking: Reported on 12/01/2018 10/10/18   Jiles HaroldLaliberte, Danielle, PA-C  pantoprazole (PROTONIX) 20 MG tablet Take 1 tablet (20 mg total) by mouth daily. Patient not taking: Reported on 01/31/2019 02/14/18   Loren RacerYelverton, David, MD  predniSONE (DELTASONE) 20 MG tablet Take 1 tablet (20 mg total) by mouth 2 (two) times daily. 10/06/19   Mancel BaleWentz, Eamon Tantillo, MD  pregabalin (LYRICA) 225 MG capsule Take 225 mg by mouth 2 (two) times daily. 12/15/18   [provider]  tiZANidine (ZANAFLEX) 4 MG tablet Take 1 tablet (4 mg total) by mouth every 8 (eight) hours as needed for muscle spasms. 10/10/18   Jiles HaroldLaliberte, Danielle, PA-C  traMADol (ULTRAM) 50 MG tablet Take 1 tablet (50 mg total) by mouth every 6 (six) hours as needed. 07/05/19   Charlynne PanderYao, David Hsienta, MD    Allergies    Patient has no known allergies.  Review of Systems   Review of Systems  All other systems reviewed and are negative.   Physical Exam Updated Vital Signs BP (!) 175/101   Pulse 74   Temp 99.1 F (37.3 C) (Oral)   Resp 18   SpO2 97%   Physical Exam Vitals and nursing note reviewed.  Constitutional:      General: She is not in acute distress.    Appearance: She is well-developed. She is obese. She is not ill-appearing, toxic-appearing or diaphoretic.  HENT:     Head: Normocephalic and atraumatic.     Right Ear: External ear normal.     Left Ear: External ear normal.  Eyes:     Conjunctiva/sclera: Conjunctivae normal.     Pupils: Pupils are equal, round, and reactive to light.  Neck:     Trachea: Phonation normal.  Cardiovascular:     Rate and Rhythm: Normal rate and regular rhythm.     Heart sounds: Normal heart sounds.  Pulmonary:     Effort: Pulmonary effort is normal. No respiratory distress.     Breath sounds: Normal breath sounds. No stridor. No wheezing or  rhonchi.  Abdominal:     General: There is no distension.     Palpations: Abdomen is soft.     Tenderness: There is no abdominal tenderness.  Musculoskeletal:        General: Normal range of motion.     Cervical back: Normal range of motion and neck supple.     Right lower leg: No edema.     Left lower leg: No edema.  Skin:    General: Skin is warm and dry.  Neurological:     Mental Status: She is alert and oriented to person, place, and time.     Cranial Nerves: No cranial nerve deficit.     Sensory: No sensory deficit.     Motor: No abnormal muscle tone.     Coordination: Coordination  normal.  Psychiatric:        Mood and Affect: Mood normal.        Behavior: Behavior normal.        Thought Content: Thought content normal.        Judgment: Judgment normal.     ED Results / Procedures / Treatments   Labs (all labs ordered are listed, but only abnormal results are displayed) Labs Reviewed  BASIC METABOLIC PANEL - Abnormal; Notable for the following components:      Result Value   Potassium 3.3 (*)    Glucose, Bld 124 (*)    All other components within normal limits  HCG, QUANTITATIVE, PREGNANCY - Abnormal; Notable for the following components:   hCG, Beta Chain, Quant, S 10 (*)    All other components within normal limits  HEPATIC FUNCTION PANEL - Abnormal; Notable for the following components:   Total Protein 8.3 (*)    All other components within normal limits  CBC  BRAIN NATRIURETIC PEPTIDE  TROPONIN I (HIGH SENSITIVITY)  TROPONIN I (HIGH SENSITIVITY)    EKG EKG Interpretation  Date/Time:  Thursday October 05 2019 21:06:01 EDT Ventricular Rate:  77 PR Interval:    QRS Duration: 88 QT Interval:  418 QTC Calculation: 474 R Axis:   60 Text Interpretation: Sinus rhythm 12 Lead; Mason-Likar No significant change since last tracing Confirmed by Melene Plan [id number]) on 10/06/2019 8:17:42 AM   Radiology DG Chest 2 View  Result Date: 10/05/2019 CLINICAL DATA:   Chest pain, dyspnea EXAM: CHEST - 2 VIEW COMPARISON:  12/26/2016 FINDINGS: Discoid atelectasis noted within the lingula. Lungs are otherwise clear. No pneumothorax or pleural effusion. Cardiac size within normal limits. Pulmonary vascularity is normal. No acute bone abnormality. IMPRESSION: No active cardiopulmonary disease. Electronically Signed   By: Helyn Numbers MD   On: 10/05/2019 20:50    Procedures Procedures (including critical care time)  Medications Ordered in ED Medications - No data to display  ED Course  I have reviewed the triage vital signs and the nursing notes.  Pertinent labs & imaging results that were available during my care of the patient were reviewed by me and considered in my medical decision making (see chart for details).    MDM Rules/Calculators/A&P                           Patient Vitals for the past 24 hrs:  BP Temp Temp src Pulse Resp SpO2  10/06/19 1249 (!) 175/101 -- -- 74 18 97 %  10/06/19 1033 (!) 152/103 99.1 F (37.3 C) Oral 79 16 97 %  10/06/19 0837 (!) 186/99 99.2 F (37.3 C) Oral 71 16 100 %  10/06/19 0620 (!) 166/95 -- -- 73 18 100 %  10/06/19 0346 (!) 178/103 -- -- 71 18 98 %  10/05/19 2058 (!) 184/109 99.6 F (37.6 C) Oral 81 17 100 %    1:27 PM Reevaluation with update and discussion. After initial assessment and treatment, an updated evaluation reveals no change in clinical status, findings discussed with the patient and all questions were answered. Mancel Bale   Medical Decision Making:  This patient is presenting for evaluation of cough with chest discomfort, which does require a range of treatment options, and is a complaint that involves a moderate risk of morbidity and mortality. The differential diagnoses include pneumonia, ACS, URI. I decided to review old records, and in summary healthy female with ongoing cough for  several weeks, without worrisome historical findings.  I did not require additional historical information  from anyone.  Clinical Laboratory Tests Ordered, included CBC, Metabolic panel and Troponin. Review indicates essentially normal findings. Radiologic Tests Ordered, included chest x-ray.  I independently Visualized: Radiographic images, which show no infiltrate or edema    Critical Interventions-clinical evaluation, laboratory testing, chest x-ray, EKG, observation reassessment  After These Interventions, the Patient was reevaluated and was found stable for discharge.  Symptoms most consistent with bronchitis in a non-smoker.  Symptoms for several weeks, so we will treat with prednisone and antibiotic.  Mild incidental hypertension, likely related to her skipping her blood pressure medicine this morning because she was waiting for to be seen in emergency department.  Note that her blood pressure medicine is not documented in the EMR but the patient states she takes "a small pink pill for blood pressure."  No indication for further ED evaluation or hospitalization at this time.  CRITICAL CARE-no Performed by: Mancel Bale  Nursing Notes Reviewed/ Care Coordinated Applicable Imaging Reviewed Interpretation of Laboratory Data incorporated into ED treatment  The patient appears reasonably screened and/or stabilized for discharge and I doubt any other medical condition or other [facility] requiring further screening, evaluation, or treatment in the ED at this time prior to discharge.  Plan: Home Medications-continue usual; Home Treatments-rest fluids; return here if the recommended treatment, does not improve the symptoms; Recommended follow up-PCP, as needed    Final Clinical Impression(s) / ED Diagnoses Final diagnoses:  None    Rx / DC Orders ED Discharge Orders         Ordered    doxycycline (VIBRAMYCIN) 100 MG capsule  2 times daily        10/06/19 1324    predniSONE (DELTASONE) 20 MG tablet  2 times daily        10/06/19 1324           Mancel Bale, MD 10/06/19 1330

## 2019-10-06 NOTE — Discharge Instructions (Addendum)
Your cough is likely related to bronchitis.  We are prescribing an antibiotic and prednisone to help that.  Using cough medicine like Robitussin can be helpful.  Also keep yourself well-hydrated with 2 or 3 glasses of water every day typically helps.  Please see your doctor if not better in a few days.

## 2022-04-25 DIAGNOSIS — X58XXXA Exposure to other specified factors, initial encounter: Secondary | ICD-10-CM | POA: Diagnosis not present

## 2022-04-25 DIAGNOSIS — M47816 Spondylosis without myelopathy or radiculopathy, lumbar region: Secondary | ICD-10-CM | POA: Diagnosis not present

## 2022-04-25 DIAGNOSIS — S39012A Strain of muscle, fascia and tendon of lower back, initial encounter: Secondary | ICD-10-CM | POA: Diagnosis not present

## 2022-05-14 ENCOUNTER — Emergency Department (HOSPITAL_COMMUNITY): Payer: Medicaid Other

## 2022-05-14 ENCOUNTER — Encounter (HOSPITAL_COMMUNITY): Payer: Self-pay | Admitting: *Deleted

## 2022-05-14 ENCOUNTER — Emergency Department (HOSPITAL_COMMUNITY)
Admission: EM | Admit: 2022-05-14 | Discharge: 2022-05-14 | Disposition: A | Discharge status: Home / Self Care | Payer: Medicaid Other | Attending: Emergency Medicine | Admitting: Emergency Medicine

## 2022-05-14 ENCOUNTER — Other Ambulatory Visit: Payer: Self-pay

## 2022-05-14 DIAGNOSIS — I10 Essential (primary) hypertension: Secondary | ICD-10-CM | POA: Diagnosis not present

## 2022-05-14 DIAGNOSIS — R791 Abnormal coagulation profile: Secondary | ICD-10-CM | POA: Diagnosis not present

## 2022-05-14 DIAGNOSIS — Z79899 Other long term (current) drug therapy: Secondary | ICD-10-CM | POA: Diagnosis not present

## 2022-05-14 DIAGNOSIS — R531 Weakness: Secondary | ICD-10-CM | POA: Diagnosis present

## 2022-05-14 DIAGNOSIS — Z87891 Personal history of nicotine dependence: Secondary | ICD-10-CM | POA: Diagnosis not present

## 2022-05-14 DIAGNOSIS — F444 Conversion disorder with motor symptom or deficit: Secondary | ICD-10-CM | POA: Diagnosis not present

## 2022-05-14 DIAGNOSIS — G43409 Hemiplegic migraine, not intractable, without status migrainosus: Secondary | ICD-10-CM | POA: Insufficient documentation

## 2022-05-14 DIAGNOSIS — R29810 Facial weakness: Secondary | ICD-10-CM | POA: Diagnosis not present

## 2022-05-14 DIAGNOSIS — R29818 Other symptoms and signs involving the nervous system: Secondary | ICD-10-CM | POA: Diagnosis not present

## 2022-05-14 DIAGNOSIS — G4489 Other headache syndrome: Secondary | ICD-10-CM | POA: Diagnosis not present

## 2022-05-14 DIAGNOSIS — G43809 Other migraine, not intractable, without status migrainosus: Secondary | ICD-10-CM | POA: Diagnosis not present

## 2022-05-14 LAB — COMPREHENSIVE METABOLIC PANEL
ALT: 19 U/L (ref 0–44)
AST: 23 U/L (ref 15–41)
Albumin: 4 g/dL (ref 3.5–5.0)
Alkaline Phosphatase: 65 U/L (ref 38–126)
Anion gap: 14 (ref 5–15)
BUN: 9 mg/dL (ref 6–20)
CO2: 23 mmol/L (ref 22–32)
Calcium: 8.7 mg/dL — ABNORMAL LOW (ref 8.9–10.3)
Chloride: 104 mmol/L (ref 98–111)
Creatinine, Ser: 1.06 mg/dL — ABNORMAL HIGH (ref 0.44–1.00)
GFR, Estimated: >60 mL/min (ref >60)
Glucose, Bld: 154 mg/dL — ABNORMAL HIGH (ref 70–99)
Potassium: 2.9 mmol/L — ABNORMAL LOW (ref 3.5–5.1)
Sodium: 141 mmol/L (ref 135–145)
Total Bilirubin: 0.5 mg/dL (ref 0.3–1.2)
Total Protein: 7.1 g/dL (ref 6.5–8.1)

## 2022-05-14 LAB — DIFFERENTIAL
Abs Immature Granulocytes: 0.02 10*3/uL (ref 0.00–0.07)
Basophils Absolute: 0 10*3/uL (ref 0.0–0.1)
Basophils Relative: 0 %
Eosinophils Absolute: 0.2 10*3/uL (ref 0.0–0.5)
Eosinophils Relative: 3 %
Immature Granulocytes: 0 %
Lymphocytes Relative: 35 %
Lymphs Abs: 2.1 10*3/uL (ref 0.7–4.0)
Monocytes Absolute: 0.3 10*3/uL (ref 0.1–1.0)
Monocytes Relative: 4 %
Neutro Abs: 3.4 10*3/uL (ref 1.7–7.7)
Neutrophils Relative %: 58 %

## 2022-05-14 LAB — I-STAT CHEM 8, ED
BUN: 10 mg/dL (ref 6–20)
Calcium, Ion: 1.08 mmol/L — ABNORMAL LOW (ref 1.15–1.40)
Chloride: 105 mmol/L (ref 98–111)
Creatinine, Ser: 1 mg/dL (ref 0.44–1.00)
Glucose, Bld: 154 mg/dL — ABNORMAL HIGH (ref 70–99)
HCT: 42 % (ref 36.0–46.0)
Hemoglobin: 14.3 g/dL (ref 12.0–15.0)
Potassium: 2.9 mmol/L — ABNORMAL LOW (ref 3.5–5.1)
Sodium: 142 mmol/L (ref 135–145)
TCO2: 25 mmol/L (ref 22–32)

## 2022-05-14 LAB — CBC
HCT: 41.3 % (ref 36.0–46.0)
Hemoglobin: 14.3 g/dL (ref 12.0–15.0)
MCH: 31 pg (ref 26.0–34.0)
MCHC: 34.6 g/dL (ref 30.0–36.0)
MCV: 89.4 fL (ref 80.0–100.0)
Platelets: 195 10*3/uL (ref 150–400)
RBC: 4.62 MIL/uL (ref 3.87–5.11)
RDW: 12.4 % (ref 11.5–15.5)
WBC: 5.9 10*3/uL (ref 4.0–10.5)
nRBC: 0 % (ref 0.0–0.2)

## 2022-05-14 LAB — CBG MONITORING, ED: Glucose-Capillary: 154 mg/dL — ABNORMAL HIGH (ref 70–99)

## 2022-05-14 LAB — PROTIME-INR
INR: 1 (ref 0.8–1.2)
Prothrombin Time: 12.8 seconds (ref 11.4–15.2)

## 2022-05-14 LAB — APTT: aPTT: 29 seconds (ref 24–36)

## 2022-05-14 LAB — ETHANOL: Alcohol, Ethyl (B): <10 mg/dL (ref <10)

## 2022-05-14 MED ORDER — DIPHENHYDRAMINE HCL 50 MG/ML IJ SOLN
12.5000 mg | Freq: Once | INTRAMUSCULAR | Status: AC
  Administered 2022-05-14: 12.5 mg via INTRAVENOUS
  Filled 2022-05-14: qty 1

## 2022-05-14 MED ORDER — POTASSIUM CHLORIDE 20 MEQ PO PACK
60.0000 meq | PACK | Freq: Once | ORAL | Status: AC
  Administered 2022-05-14: 60 meq via ORAL
  Filled 2022-05-14: qty 3

## 2022-05-14 MED ORDER — SODIUM CHLORIDE 0.9 % IV BOLUS
1000.0000 mL | Freq: Once | INTRAVENOUS | Status: AC
  Administered 2022-05-14: 1000 mL via INTRAVENOUS

## 2022-05-14 MED ORDER — PROCHLORPERAZINE EDISYLATE 10 MG/2ML IJ SOLN
10.0000 mg | Freq: Once | INTRAMUSCULAR | Status: AC
  Administered 2022-05-14: 10 mg via INTRAVENOUS
  Filled 2022-05-14: qty 2

## 2022-05-14 MED ORDER — KETOROLAC TROMETHAMINE 15 MG/ML IJ SOLN
15.0000 mg | Freq: Once | INTRAMUSCULAR | Status: AC
  Administered 2022-05-14: 15 mg via INTRAVENOUS
  Filled 2022-05-14: qty 1

## 2022-05-14 MED ORDER — ACETAMINOPHEN 500 MG PO TABS
1000.0000 mg | ORAL_TABLET | Freq: Once | ORAL | Status: AC
  Administered 2022-05-14: 1000 mg via ORAL
  Filled 2022-05-14: qty 2

## 2022-05-14 MED ORDER — SODIUM CHLORIDE 0.9% FLUSH
3.0000 mL | Freq: Once | INTRAVENOUS | Status: AC
  Administered 2022-05-14: 3 mL via INTRAVENOUS

## 2022-05-14 NOTE — ED Notes (Signed)
Into MRI

## 2022-05-14 NOTE — Consult Note (Signed)
Neurology Consultation  Reason for Consult: Code Stroke Referring Physician:   CC: AMS, gait disturbance  History is obtained from: Patient, EMS  HPI: [name] [name] is a 62 y.o. female with a past medical history of bipolar 1, HTN, HLD, and migraines presenting with altered mental status, gait disturbance, mild left side weakness and numbness. She was at the mall with her son this afternoon in her usual state of health and he noticed that when they got home she started acting different from her baseline and was having trouble walking. They left the mall at 1615 and she was able to walk to the car normally. She does endorse a headache. Upon further questioning she does state that this is the worst headache she's had and it began about two days ago. She has been able to do her usual activities over the last two days. She was immediately taken to CT and then MRI. Diffusion imaging negative for acute infarct. Code stroke cancelled.   LKW: 1615 tpa given?: No, symptoms to mild Premorbid modified Rankin scale (mRS): 0-Completely asymptomatic and back to baseline post-stroke     ROS: Unable to obtain due to altered mental status.   Past Medical History:  Diagnosis Date   Arthritis    Chronic back pain    Fibromyalgia    GERD (gastroesophageal reflux disease)    Hypertension     No family history on file.   Social History:   reports that she has quit smoking. She has never used smokeless tobacco. She reports current alcohol use. She reports that she does not use drugs.  Medications  Current Facility-Administered Medications:    sodium chloride flush (NS) 0.9 % injection 3 mL, 3 mL, Intravenous, Once, Scheving, Jerilee Field, MD  Current Outpatient Medications:    amLODipine (NORVASC) 5 MG tablet, Take 5 mg by mouth daily., Disp: , Rfl: 2   atorvastatin (LIPITOR) 10 MG tablet, Take 10 mg by mouth daily., Disp: , Rfl:    butalbital-acetaminophen-caffeine (FIORICET) 50-325-40 MG  tablet, Take 1 tablet by mouth every 6 (six) hours as needed for migraine., Disp: , Rfl:    cyclobenzaprine (FLEXERIL) 10 MG tablet, Take 1 tablet (10 mg total) by mouth 3 (three) times daily as needed for muscle spasms. (Patient not taking: Reported on 12/01/2018), Disp: 30 tablet, Rfl: 0   doxycycline (VIBRAMYCIN) 100 MG capsule, Take 1 capsule (100 mg total) by mouth 2 (two) times daily. One po bid x 7 days, Disp: 14 capsule, Rfl: 0   EMGALITY 120 MG/ML SOAJ, Inject 120 mg into the skin every 30 (thirty) days., Disp: , Rfl:    hydrochlorothiazide (HYDRODIURIL) 25 MG tablet, Take 1 tablet (25 mg total) by mouth daily., Disp: 30 tablet, Rfl: 0   HYDROcodone-acetaminophen (NORCO) 5-325 MG tablet, Take 1 tablet by mouth every 6 (six) hours as needed for severe pain. (Patient not taking: Reported on 01/31/2019), Disp: 6 tablet, Rfl: 0   hydrOXYzine (ATARAX/VISTARIL) 25 MG tablet, Take 0.5-1 tablets (12.5-25 mg total) by mouth every 8 (eight) hours as needed for itching. (Patient not taking: Reported on 01/31/2019), Disp: 30 tablet, Rfl: 0   omeprazole (PRILOSEC) 40 MG capsule, Take 40 mg by mouth daily., Disp: , Rfl:    ondansetron (ZOFRAN) 4 MG tablet, Take 1 tablet (4 mg total) by mouth every 6 (six) hours as needed for nausea or vomiting. (Patient not taking: Reported on 01/31/2019), Disp: 12 tablet, Rfl: 0   oxyCODONE-acetaminophen (PERCOCET) 5-325 MG tablet, Take 1-2 tablets  every 4 hours as needed for post operative pain. MAX 6/day (Patient not taking: Reported on 12/01/2018), Disp: 30 tablet, Rfl: 0   pantoprazole (PROTONIX) 20 MG tablet, Take 1 tablet (20 mg total) by mouth daily. (Patient not taking: Reported on 01/31/2019), Disp: 30 tablet, Rfl: 3   predniSONE (DELTASONE) 20 MG tablet, Take 1 tablet (20 mg total) by mouth 2 (two) times daily., Disp: 10 tablet, Rfl: 0   pregabalin (LYRICA) 225 MG capsule, Take 225 mg by mouth 2 (two) times daily., Disp: , Rfl:    tiZANidine (ZANAFLEX) 4 MG tablet,  Take 1 tablet (4 mg total) by mouth every 8 (eight) hours as needed for muscle spasms., Disp: 30 tablet, Rfl: 1   traMADol (ULTRAM) 50 MG tablet, Take 1 tablet (50 mg total) by mouth every 6 (six) hours as needed., Disp: 10 tablet, Rfl: 0  Exam: Current vital signs: There were no vitals taken for this visit. Vital signs in last 24 hours: BP: ()/()  Arterial Line BP: ()/()   GENERAL: Awake, alert in NAD HEENT: - Normocephalic and atraumatic, dry mm, no LN++, no Thyromegally LUNGS - Clear to auscultation bilaterally with no wheezes CV - S1S2 RRR, no m/r/g, equal pulses bilaterally. ABDOMEN - Soft, nontender, nondistended with normoactive BS Ext: warm, well perfused, intact peripheral pulses, no edema  NEURO:  Mental Status: AA&Ox3  Language: speech is clear.  Naming, repetition, fluency, and comprehension intact. Cranial Nerves: PERRL. EOMI, visual fields full, no facial asymmetry, facial sensation diminished to light touch on left forehead at midline, hearing intact, tongue/uvula/soft palate midline, normal sternocleidomastoid and trapezius muscle strength. No evidence of tongue atrophy or fasciculations Motor:  RUE  LUE RLE  LLE Tone: is normal and bulk is normal Sensation- Intact to light touch bilaterally Coordination: FTN intact bilaterally, no ataxia in BLE. Gait- deferred   Labs I have reviewed labs in epic and the results pertinent to this consultation are:  CBC    Component Value Date/Time   WBC 8.7 10/05/2019 2053   RBC 4.20 10/05/2019 2053   HGB 13.5 10/05/2019 2053   HCT 38.8 10/05/2019 2053   PLT 282 10/05/2019 2053   MCV 92.4 10/05/2019 2053   MCH 32.1 10/05/2019 2053   MCHC 34.8 10/05/2019 2053   RDW 11.9 10/05/2019 2053   LYMPHSABS 2.0 01/13/2015 1843   MONOABS 0.3 01/13/2015 1843   EOSABS 0.1 01/13/2015 1843   BASOSABS 0.0 01/13/2015 1843    CMP     Component Value Date/Time   NA 139 10/05/2019 2053   K 3.3 (L) 10/05/2019 2053   CL 106 10/05/2019  2053   CO2 24 10/05/2019 2053   GLUCOSE 124 (H) 10/05/2019 2053   BUN 14 10/05/2019 2053   CREATININE 0.90 10/05/2019 2053   CALCIUM 8.9 10/05/2019 2053   PROT 8.3 (H) 10/05/2019 2053   ALBUMIN 4.0 10/05/2019 2053   AST 16 10/05/2019 2053   ALT 18 10/05/2019 2053   ALKPHOS 65 10/05/2019 2053   BILITOT 0.5 10/05/2019 2053   GFRNONAA >60 10/05/2019 2053   GFRAA >60 10/05/2019 2053    Lipid Panel     Component Value Date/Time   CHOL 220 (H) 03/09/2008 2017   TRIG 147 03/09/2008 2017   HDL 45 03/09/2008 2017   CHOLHDL 4.9 Ratio 03/09/2008 2017   VLDL 29 03/09/2008 2017   LDLCALC 146 (H) 03/09/2008 2017     Imaging I have reviewed the images obtained:  CT-head 1. Possible age-indeterminate infarct in the  right caudate. No hemorrhage. 2. Severe chronic microvascular ischemic change.  MRI examination of the brain  Assess16ment:  61 y.o. female with a past medical history of bipolar 1, HTN, HLD, and migraines presenting with altered mental status, gait disturbance, mild left side weakness and numbness. She was at the mall with her son this afternoon in her usual state of health and he noticed that when they got home she started acting different from her baseline and was having trouble walking. They left the mall at 1615 and she was able to walk to the car normally. She does endorse a headache. MRI negative for acute infarct. Code stroke cancelled.   Impression: Acute ischemic infarct vs complex migraine vs hypertensive encephalopathy  Recommendations: - Stroke work up if MRI is positive - Treatment of headache    Patient seen and examined by NP/APP with MD. MD to update note as needed.   Elmer Picker, DNP, FNP-BC [facility] Pager: [phone]  NEUROHOSPITALIST ADDENDUM Performed a face to face diagnostic evaluation.   I have reviewed the contents of history and physical exam as documented by PA/ARNP/Resident and agree with above documentation.  I have  discussed and formulated the above plan as documented. Edits to the note have been made as needed.  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 In addition, reports 20 out of 10 headache typical of her migraine headache but very severe.  Low overall suspicion for stroke despite concern for a potential R BG age indeterminate stroke on CT Head.  Will get MRI Brain but if negative, migraine cocktail and okay to discharge home.  Reviewed MRI Brain which was negative for acute stroke. Stroke code cancelled. No need for stroke workup.  Erick Blinks, MD [facility] [phone]   If 7pm to 7am, please call on call as listed on AMION.

## 2022-05-14 NOTE — Code Documentation (Signed)
Stroke Response Nurse Documentation Code Documentation  [name] [name] is a 62 y.o. female arriving to [facility]  via [facility] on 05/14/2022 with past medical hx of fibromyalgia, HTN, chronic migraines. On No antithrombotic. Code stroke was activated by EMS.   Patient from home where she was LKW at 1615 and now complaining of left sided facial weakness and slurred speech. Per EMS, patient was at the mall with her son and left at 65. By the time she got home which is a five minute drive from the mall, patient was having slurred speech and couldn't walk straight. EMS noticed slurred speech and left sided facial weakness upon arrival.   Stroke team at the bedside on patient arrival. Labs drawn and patient cleared for CT by EDP. Patient to CT with team. NIHSS 4, see documentation for details and code stroke times. Patient with left arm weakness, left leg weakness, left decreased sensation, and dysarthria  on exam. The following imaging was completed:  CT Head. Patient is not a candidate for IV Thrombolytic due to symptoms too mild. Patient is not not a candidate for IR due to no LVO noted on imaging.   Cancel Code [place].   Bedside handoff with ED RN Cletis Athens.    Felecia Jan  Stroke Response RN

## 2022-05-14 NOTE — ED Notes (Signed)
Pt ambulatory to rr with steady gait reports pain 3/10 feels much better is talking on phone with family at present

## 2022-05-14 NOTE — ED Notes (Signed)
Back to room 32. Family x2 at [facility]. Attempted BP to void, no output. No changes. Alert, NAD, calm, interactive.

## 2022-05-14 NOTE — ED Notes (Signed)
Into room 32 from CT, pending imminent MRI.

## 2022-05-14 NOTE — ED Notes (Signed)
CT complete,, neuro at [facility], assessment continues

## 2022-05-14 NOTE — ED Provider Notes (Signed)
[place] [facility]MENT AT [facility] Provider Note  CSN: [id number] Arrival date & time: 05/14/22 1705  Chief Complaint(s) Code Stroke  HPI [name] [name] is a 62 y.o. female history of fibromyalgia, hypertension, chronic migraines presenting to the emergency department with weakness as a code stroke.  Patient reports that she developed acute onset of left-sided weakness, numbness and tingling at around 4:15 PM today.  She also reports that she has had a headache for the past 2 days.  Denies any fevers, neck stiffness.  She reports a headache similar to her chronic migraine headaches.  She denies any head trauma.  She denies similar episodes in the past.  Patient brought in by EMS.  Patient taken immediately to CT   Past Medical History Past Medical History:  Diagnosis Date   Arthritis    Chronic back pain    Fibromyalgia    GERD (gastroesophageal reflux disease)    Hypertension    Patient Active Problem List   Diagnosis Date Noted   Cervicalgia 03/05/2016   Post laminectomy syndrome 03/05/2016   Chronic pain syndrome 03/05/2016   Fibromyalgia 03/05/2016   Chronic right shoulder pain 11/25/2015   Home Medication(s) Prior to Admission medications   Medication Sig Start Date End Date Taking? Authorizing Provider  amLODipine (NORVASC) 5 MG tablet Take 5 mg by mouth daily. 04/22/17   [provider]  atorvastatin (LIPITOR) 10 MG tablet Take 10 mg by mouth daily. 09/21/18   [provider]  butalbital-acetaminophen-caffeine (FIORICET) 50-325-40 MG tablet Take 1 tablet by mouth every 6 (six) hours as needed for migraine. 08/15/18   [provider]  cyclobenzaprine (FLEXERIL) 10 MG tablet Take 1 tablet (10 mg total) by mouth 3 (three) times daily as needed for muscle spasms. Patient not taking: Reported on 12/01/2018 10/29/14   Linwood Dibbles, MD  doxycycline (VIBRAMYCIN) 100 MG capsule Take 1 capsule (100 mg total) by mouth 2 (two) times  daily. One po bid x 7 days 10/06/19   Mancel Bale, MD  EMGALITY 120 MG/ML SOAJ Inject 120 mg into the skin every 30 (thirty) days. 01/02/19   [provider]  hydrochlorothiazide (HYDRODIURIL) 25 MG tablet Take 1 tablet (25 mg total) by mouth daily. 08/17/14   Muthersbaugh, Dahlia Client, PA-C  HYDROcodone-acetaminophen (NORCO) 5-325 MG tablet Take 1 tablet by mouth every 6 (six) hours as needed for severe pain. Patient not taking: Reported on 01/31/2019 05/14/17   Loren Racer, MD  hydrOXYzine (ATARAX/VISTARIL) 25 MG tablet Take 0.5-1 tablets (12.5-25 mg total) by mouth every 8 (eight) hours as needed for itching. Patient not taking: Reported on 01/31/2019 03/02/17   Maczis, Elmer Sow, PA-C  omeprazole (PRILOSEC) 40 MG capsule Take 40 mg by mouth daily. 10/24/18   [provider]  ondansetron (ZOFRAN) 4 MG tablet Take 1 tablet (4 mg total) by mouth every 6 (six) hours as needed for nausea or vomiting. Patient not taking: Reported on 01/31/2019 02/14/18   Loren Racer, MD  oxyCODONE-acetaminophen (PERCOCET) 5-325 MG tablet Take 1-2 tablets every 4 hours as needed for post operative pain. MAX 6/day Patient not taking: Reported on 12/01/2018 10/10/18   Jiles Harold, PA-C  pantoprazole (PROTONIX) 20 MG tablet Take 1 tablet (20 mg total) by mouth daily. Patient not taking: Reported on 01/31/2019 02/14/18   Loren Racer, MD  predniSONE (DELTASONE) 20 MG tablet Take 1 tablet (20 mg total) by mouth 2 (two) times daily. 10/06/19   Mancel Bale, MD  pregabalin (LYRICA) 225 MG capsule  Take 225 mg by mouth 2 (two) times daily. 12/15/18   [provider]  tiZANidine (ZANAFLEX) 4 MG tablet Take 1 tablet (4 mg total) by mouth every 8 (eight) hours as needed for muscle spasms. 10/10/18   Jiles Harold, PA-C  traMADol (ULTRAM) 50 MG tablet Take 1 tablet (50 mg total) by mouth every 6 (six) hours as needed. 07/05/19   Charlynne Pander, MD                                                                                                                                     Past Surgical History Past Surgical History:  Procedure Laterality Date   APPENDECTOMY     CARPAL TUNNEL RELEASE Right    CHOLECYSTECTOMY     HEMORRHOID SURGERY     NECK SURGERY     SHOULDER ARTHROSCOPY WITH ROTATOR CUFF REPAIR Right 10/10/2018   Procedure: SHOULDER ARTHROSCOPY WITH ROTATOR CUFF REPAIR;  Surgeon: Jones Broom, MD;  Location: [facility];  Service: Orthopedics;  Laterality: Right;   SHOULDER ARTHROSCOPY WITH SUBACROMIAL DECOMPRESSION Right 10/10/2018   Procedure: SHOULDER ARTHROSCOPY WITH SUBACROMIAL DECOMPRESSION; DISTAL CLAVICLE EXCISION;  Surgeon: Jones Broom, MD;  Location:  SURGERY CENTER;  Service: Orthopedics;  Laterality: Right;   Family History History reviewed. No pertinent family history.  Social History Social History   Tobacco Use   Smoking status: Former   Smokeless tobacco: Never  Substance Use Topics   Alcohol use: Yes    Comment: socially   Drug use: No   Allergies Patient has no known allergies.  Review of Systems Review of Systems  All other systems reviewed and are negative.   Physical Exam Vital Signs  I have reviewed the triage vital signs BP 119/77   Pulse 69   Temp 98.1 F (36.7 C) (Oral)   Resp 14   Wt 78.6 kg   SpO2 97%   BMI 32.74 kg/m  Physical Exam Vitals and nursing note reviewed.  Constitutional:      General: She is not in acute distress.    Appearance: She is well-developed.  HENT:     Head: Normocephalic and atraumatic.     Mouth/Throat:     Mouth: Mucous membranes are moist.  Eyes:     Pupils: Pupils are equal, round, and reactive to light.  Cardiovascular:     Rate and Rhythm: Normal rate and regular rhythm.     Heart sounds: No murmur heard. Pulmonary:     Effort: Pulmonary effort is normal. No respiratory distress.     Breath sounds: Normal breath sounds.  Abdominal:     General:  Abdomen is flat.     Palpations: Abdomen is soft.     Tenderness: There is no abdominal tenderness.  Musculoskeletal:        General: No tenderness.     Cervical back: Neck supple. No rigidity.  Right lower leg: No edema.     Left lower leg: No edema.  Skin:    General: Skin is warm and dry.  Neurological:     Mental Status: She is alert.     Comments: Cranial nerves II through XII seem grossly intact with exception of may be slight sensory deficit on the left side although splits midline.  4+ out of 5 strength in the left upper extremity although seems somewhat volitional, also reports some sensory deficit in the left upper extremity.  5 of 5 strength in the right upper extremity and in the lower extremities without focal sensory deficit.  Psychiatric:        Mood and Affect: Mood normal.        Behavior: Behavior normal.     ED Results and Treatments Labs (all labs ordered are listed, but only abnormal results are displayed) Labs Reviewed  COMPREHENSIVE METABOLIC PANEL - Abnormal; Notable for the following components:      Result Value   Potassium 2.9 (*)    Glucose, Bld 154 (*)    Creatinine, Ser 1.06 (*)    Calcium 8.7 (*)    All other components within normal limits  I-STAT CHEM 8, ED - Abnormal; Notable for the following components:   Potassium 2.9 (*)    Glucose, Bld 154 (*)    Calcium, Ion 1.08 (*)    All other components within normal limits  CBG MONITORING, ED - Abnormal; Notable for the following components:   Glucose-Capillary 154 (*)    All other components within normal limits  PROTIME-INR  APTT  CBC  DIFFERENTIAL  ETHANOL                                                                                                                          Radiology MR BRAIN WO CONTRAST  Result Date: 05/14/2022 CLINICAL DATA:  Initial evaluation for neuro deficit, stroke suspected. EXAM: MRI HEAD WITHOUT CONTRAST TECHNIQUE: Multiplanar, multiecho pulse sequences of  the brain and surrounding structures were obtained without intravenous contrast. COMPARISON:  CT from earlier the same day. FINDINGS: Brain: Examination moderately degraded by motion artifact. Cerebral volume within normal limits. Patchy T2/FLAIR hyperintensity involving the periventricular and deep white matter both cerebral hemispheres as well as the pons, most characteristic of chronic microvascular ischemic disease, moderately advanced in nature. No evidence for acute or subacute ischemia. No areas of chronic cortical infarction. No acute intracranial hemorrhage. Multiple scattered chronic micro hemorrhages noted, most pronounced about the brainstem and thalami, most characteristic of chronic poorly controlled hypertension. No mass lesion, midline shift or mass effect. No hydrocephalus or extra-axial fluid collection. Pituitary gland suprasellar region grossly within normal limits. Vascular: Major intracranial vascular flow voids are maintained. Skull and upper cervical spine: Craniocervical junction within normal limits. Postsurgical changes partially visualize within the upper cervical spine. Bone marrow signal intensity grossly normal. No scalp soft tissue abnormality. Sinuses/Orbits: Prior bilateral ocular lens replacement. Paranasal sinuses are clear.  No mastoid effusion. Other: None. IMPRESSION: 1. No acute intracranial abnormality. 2. Moderately advanced chronic microvascular ischemic disease. 3. Multiple scattered chronic micro hemorrhages, most pronounced about the brainstem and thalami, most characteristic of chronic poorly controlled hypertension. Electronically Signed   By: Rise Mu M.D.   On: 05/14/2022 18:41   CT HEAD CODE STROKE WO CONTRAST  Result Date: 05/14/2022 CLINICAL DATA:  Code stroke.  Unknown symptoms EXAM: CT HEAD WITHOUT CONTRAST TECHNIQUE: Contiguous axial images were obtained from the base of the skull through the vertex without intravenous contrast. RADIATION DOSE  REDUCTION: This exam was performed according to the departmental dose-optimization program which includes automated exposure control, adjustment of the mA and/or kV according to patient size and/or use of iterative reconstruction technique. COMPARISON:  CT Head 04/11/22 FINDINGS: Brain: There is sequela of severe chronic microvascular ischemic change. There is no CT evidence of an acute cortical infarct. No hemorrhage. There may be an age indeterminate infarct in the right caudate (series 2, image 19). No hydrocephalus. No extra-axial fluid collection. Vascular: No hyperdense vessel or unexpected calcification. Skull: Normal. Negative for fracture or focal lesion. Sinuses/Orbits: No middle ear or mastoid effusion. Paranasal sinuses are clear. Bilateral lens replacement. Orbits are otherwise unremarkable. Other: None. ASPECTS [facility] Stroke Program Early CT Score): No localizing signs were available. IMPRESSION: 1. Possible age-indeterminate infarct in the right caudate. No hemorrhage. 2. Severe chronic microvascular ischemic change. Findings were paged to Dr. Derry Lory on 05/14/22 at 5:22 PM via [facility] paging system. Electronically Signed   By: Lorenza Cambridge M.D.   On: 05/14/2022 17:25    Pertinent labs & imaging results that were available during my care of the patient were reviewed by me and considered in my medical decision making (see MDM for details).  Medications Ordered in ED Medications  potassium chloride (KLOR-CON) packet 60 mEq (has no administration in time range)  sodium chloride flush (NS) 0.9 % injection 3 mL (3 mLs Intravenous Given 05/14/22 1722)  prochlorperazine (COMPAZINE) injection 10 mg (10 mg Intravenous Given 05/14/22 1739)  diphenhydrAMINE (BENADRYL) injection 12.5 mg (12.5 mg Intravenous Given 05/14/22 1738)  acetaminophen (TYLENOL) tablet 1,000 mg (1,000 mg Oral Given 05/14/22 1836)  sodium chloride 0.9 % bolus 1,000 mL (0 mLs Intravenous Stopped 05/14/22 1816)  ketorolac (TORADOL)  15 MG/ML injection 15 mg (15 mg Intravenous Given 05/14/22 1854)                                                                                                                                     Procedures Procedures  (including critical care time)  Medical Decision Making / ED Course   MDM:  62 year old female presenting to the emergency department as a code stroke.  Patient does have some left-sided weakness and subjective numbness although some of this could be functional.  Neurology also concerned that some of it may be functional.  Initial head CT does show age-indeterminate  infarct, they are taking her for a rapid MRI to evaluate for diffusion restriction this patient still in time for tPA.  If MRI does show any findings today likely will give tPA.  Also give treatment for migraine cocktail as patient may have complex migraine.  Will follow-up further neurology recommendations.  CT head without evidence of intracranial bleeding.  Will reassess.  Clinical Course as of 05/14/22 2157  Thu May 14, 2022  2153 MRI was negative for stroke.  Patient reports feeling significantly better after migraine cocktail.  She reports her numbness and tingling, weakness have resolved.  Neurology feels patient is safe for discharge.  Advised her to follow-up with her primary physician for further management of her chronic migraines and headaches.  Suspect cause of symptoms was ultimately complex migraine. Will discharge patient to home. All questions answered. Patient comfortable with plan of discharge. Return precautions discussed with patient and specified on the after visit summary. [WS]    Clinical Course User Index [WS] Lonell Grandchild, MD     Additional history obtained: -Additional history obtained from ems    Lab Tests: -I ordered, reviewed, and interpreted labs.   The pertinent results include:   Labs Reviewed  COMPREHENSIVE METABOLIC PANEL - Abnormal; Notable for the following  components:      Result Value   Potassium 2.9 (*)    Glucose, Bld 154 (*)    Creatinine, Ser 1.06 (*)    Calcium 8.7 (*)    All other components within normal limits  I-STAT CHEM 8, ED - Abnormal; Notable for the following components:   Potassium 2.9 (*)    Glucose, Bld 154 (*)    Calcium, Ion 1.08 (*)    All other components within normal limits  CBG MONITORING, ED - Abnormal; Notable for the following components:   Glucose-Capillary 154 (*)    All other components within normal limits  PROTIME-INR  APTT  CBC  DIFFERENTIAL  ETHANOL    Notable for hypokalemia, will replete  EKG   EKG Interpretation  Date/Time:  Thursday May 14 2022 17:25:41 EDT Ventricular Rate:  82 PR Interval:  157 QRS Duration: 86 QT Interval:  410 QTC Calculation: 479 R Axis:   55 Text Interpretation: Sinus rhythm Confirmed by Alvino Blood ([id number]) on 05/14/2022 5:49:26 PM         Imaging Studies ordered: I ordered imaging studies including MRI brain, CT head On my interpretation imaging demonstrates no stroke I independently visualized and interpreted imaging. I agree with the radiologist interpretation   Medicines ordered and prescription drug management: Meds ordered this encounter  Medications   sodium chloride flush (NS) 0.9 % injection 3 mL   prochlorperazine (COMPAZINE) injection 10 mg   diphenhydrAMINE (BENADRYL) injection 12.5 mg   acetaminophen (TYLENOL) tablet 1,000 mg   sodium chloride 0.9 % bolus 1,000 mL   ketorolac (TORADOL) 15 MG/ML injection 15 mg   potassium chloride (KLOR-CON) packet 60 mEq    -I have reviewed the patients home medicines and have made adjustments as needed   Consultations Obtained: I requested consultation with the neurologist,  and discussed lab and imaging findings as well as pertinent plan - they recommend: no further stroke workup   Cardiac Monitoring: The patient was maintained on a cardiac monitor.  I personally viewed and interpreted  the cardiac monitored which showed an underlying rhythm of: NSR  Social Determinants of Health:  Diagnosis or treatment significantly limited by social determinants of health: obesity   Reevaluation:  After the interventions noted above, I reevaluated the patient and found that their symptoms have resolved  Co morbidities that complicate the patient evaluation  Past Medical History:  Diagnosis Date   Arthritis    Chronic back pain    Fibromyalgia    GERD (gastroesophageal reflux disease)    Hypertension       Dispostion: Disposition decision including need for hospitalization was considered, and patient discharged from emergency department.    Final Clinical Impression(s) / ED Diagnoses Final diagnoses:  Hemiplegic migraine without status migrainosus, not intractable     This chart was dictated using voice recognition software.  Despite best efforts to proofread,  errors can occur which can change the documentation meaning.    Lonell Grandchild, MD 05/14/22 2157

## 2022-05-14 NOTE — ED Notes (Signed)
Meds given for severe HA. Neuro team and primary RN present with pt in MRI, remains on monitor.

## 2022-05-14 NOTE — ED Notes (Signed)
Neuro at BS.

## 2022-05-14 NOTE — ED Notes (Signed)
Assumed care of pt who arrived to Er as code stroke and has dx results that have downgraded condition to migraine ha. No further neuro tests or evaluations needs per stroke team. Pt a/o x 4 no deficits noted respirations even and non labored is finishing migraine cocktail and pending re assessment for dc home.

## 2022-05-14 NOTE — ED Notes (Signed)
To MRI

## 2022-05-14 NOTE — Discharge Instructions (Addendum)
We evaluated you for your weakness.  Your MRI did not show any signs of a stroke.  It did show some signs of some chronic changes due to uncontrolled high blood pressure, so make sure you are following up with your primary doctor to get your blood pressure checked.  We think your symptoms were most likely caused by a complex migraine.  Sometimes complex migraines can cause you to develop weakness on one half of your body which can seem like a stroke.  Your symptoms improved with treatment with migraine medications.  We noticed your potassium was low.  This is likely not related to your symptoms.  We gave you some potassium in the emergency department.  Please eat potassium rich foods for the next few days like potatoes or bananas. You can have this re-checked with your primary doctor.   Please follow-up closely with your primary doctor.  If you develop any new or worsening symptoms such as fainting, recurrent severe headaches, weakness, dizziness, vision changes, or any other concerning symptoms, please return to the emergency department.

## 2022-05-14 NOTE — ED Notes (Signed)
Code Stroke Cancelled in MRI
